# Patient Record
Sex: Male | Born: 1945 | Race: White | Hispanic: No | Marital: Married
Health system: Southern US, Community
[De-identification: ages and names within clinical notes are randomized; demographics above are authoritative.]

---

## 2008-06-16 ENCOUNTER — Ambulatory Visit: Payer: Self-pay | Admitting: Sports Medicine

## 2008-06-16 DIAGNOSIS — M7500 Adhesive capsulitis of unspecified shoulder: Secondary | ICD-10-CM | POA: Insufficient documentation

## 2008-06-16 DIAGNOSIS — M719 Bursopathy, unspecified: Secondary | ICD-10-CM

## 2008-06-16 DIAGNOSIS — M67919 Unspecified disorder of synovium and tendon, unspecified shoulder: Secondary | ICD-10-CM | POA: Insufficient documentation

## 2008-08-17 ENCOUNTER — Ambulatory Visit: Payer: Self-pay | Admitting: Sports Medicine

## 2008-08-17 DIAGNOSIS — M25569 Pain in unspecified knee: Secondary | ICD-10-CM | POA: Insufficient documentation

## 2008-08-18 ENCOUNTER — Encounter: Admission: RE | Admit: 2008-08-18 | Discharge: 2008-08-18 | Payer: Self-pay | Admitting: Family Medicine

## 2008-08-18 IMAGING — CR DG KNEE 1-2V*L*
2 series · 2 of 2 positions shown · non-contrast
Comparison: None.

CLINICAL DATA: Left knee pain

LEFT KNEE - 1-2 VIEW

[view not recorded (1 of 2)]
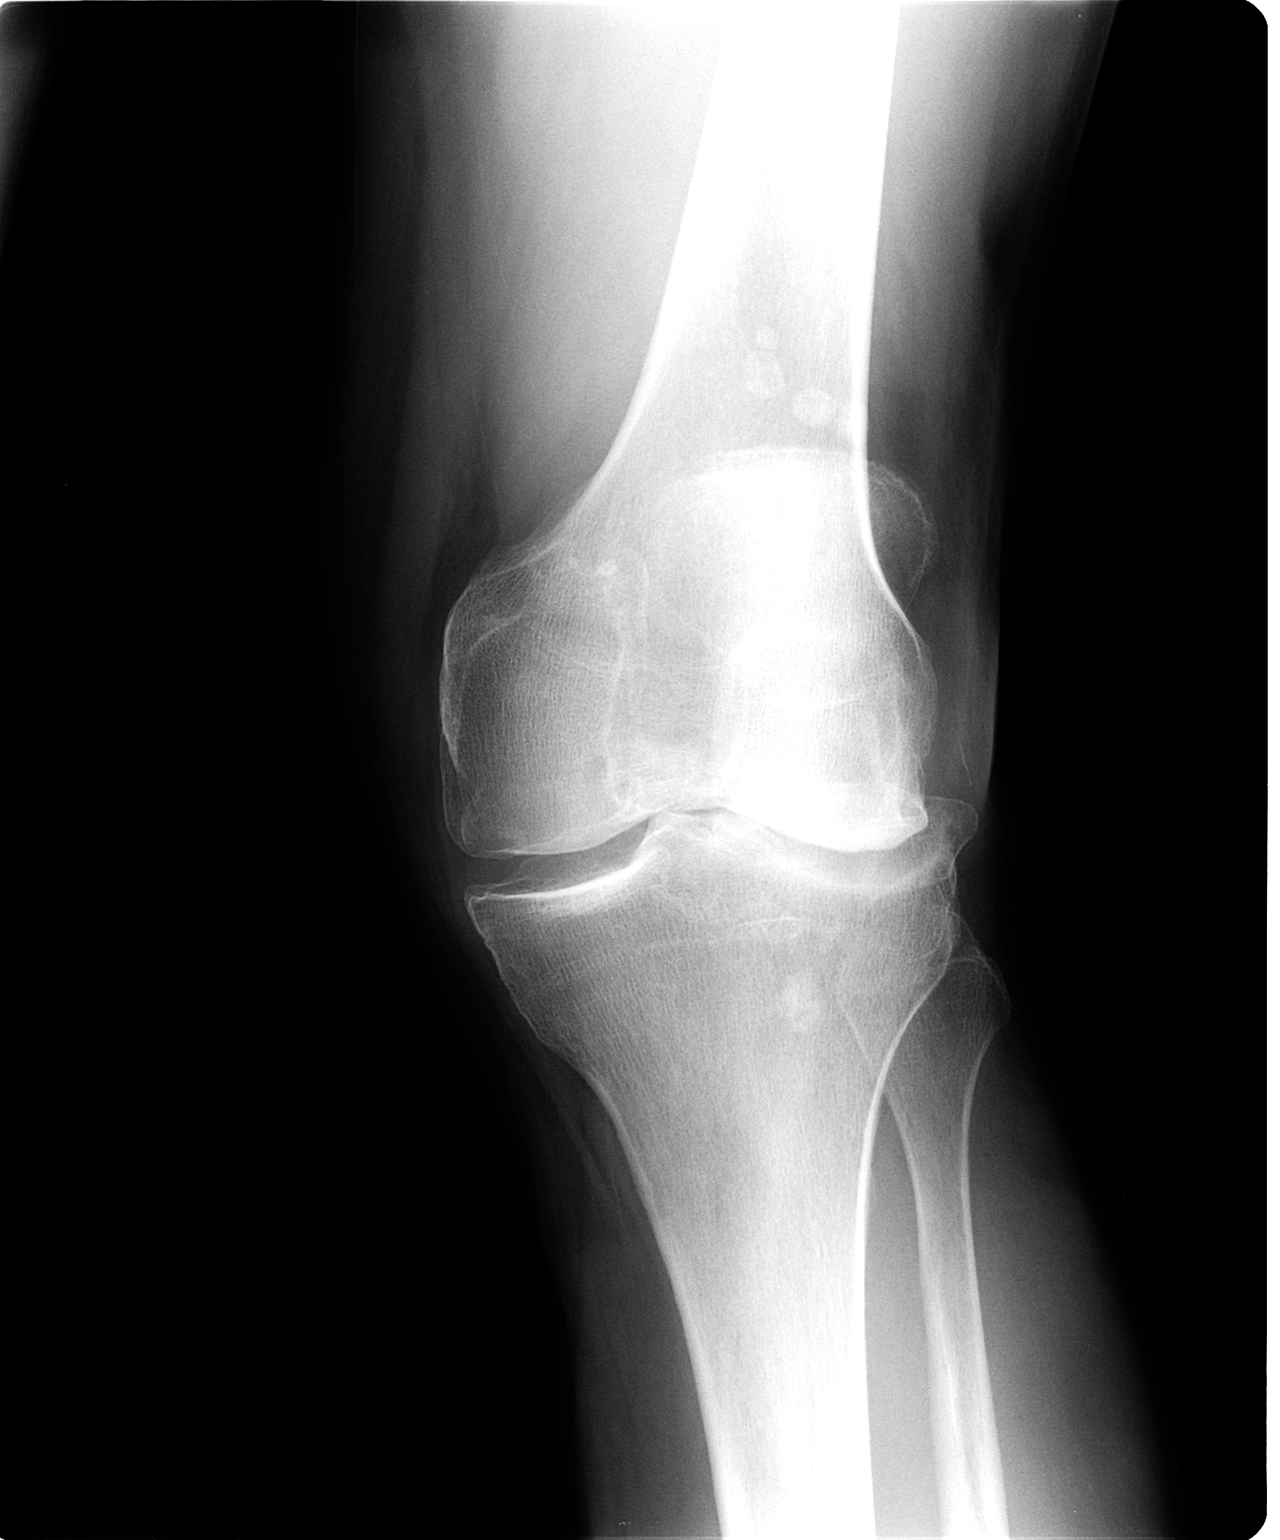

[view not recorded (2 of 2)]
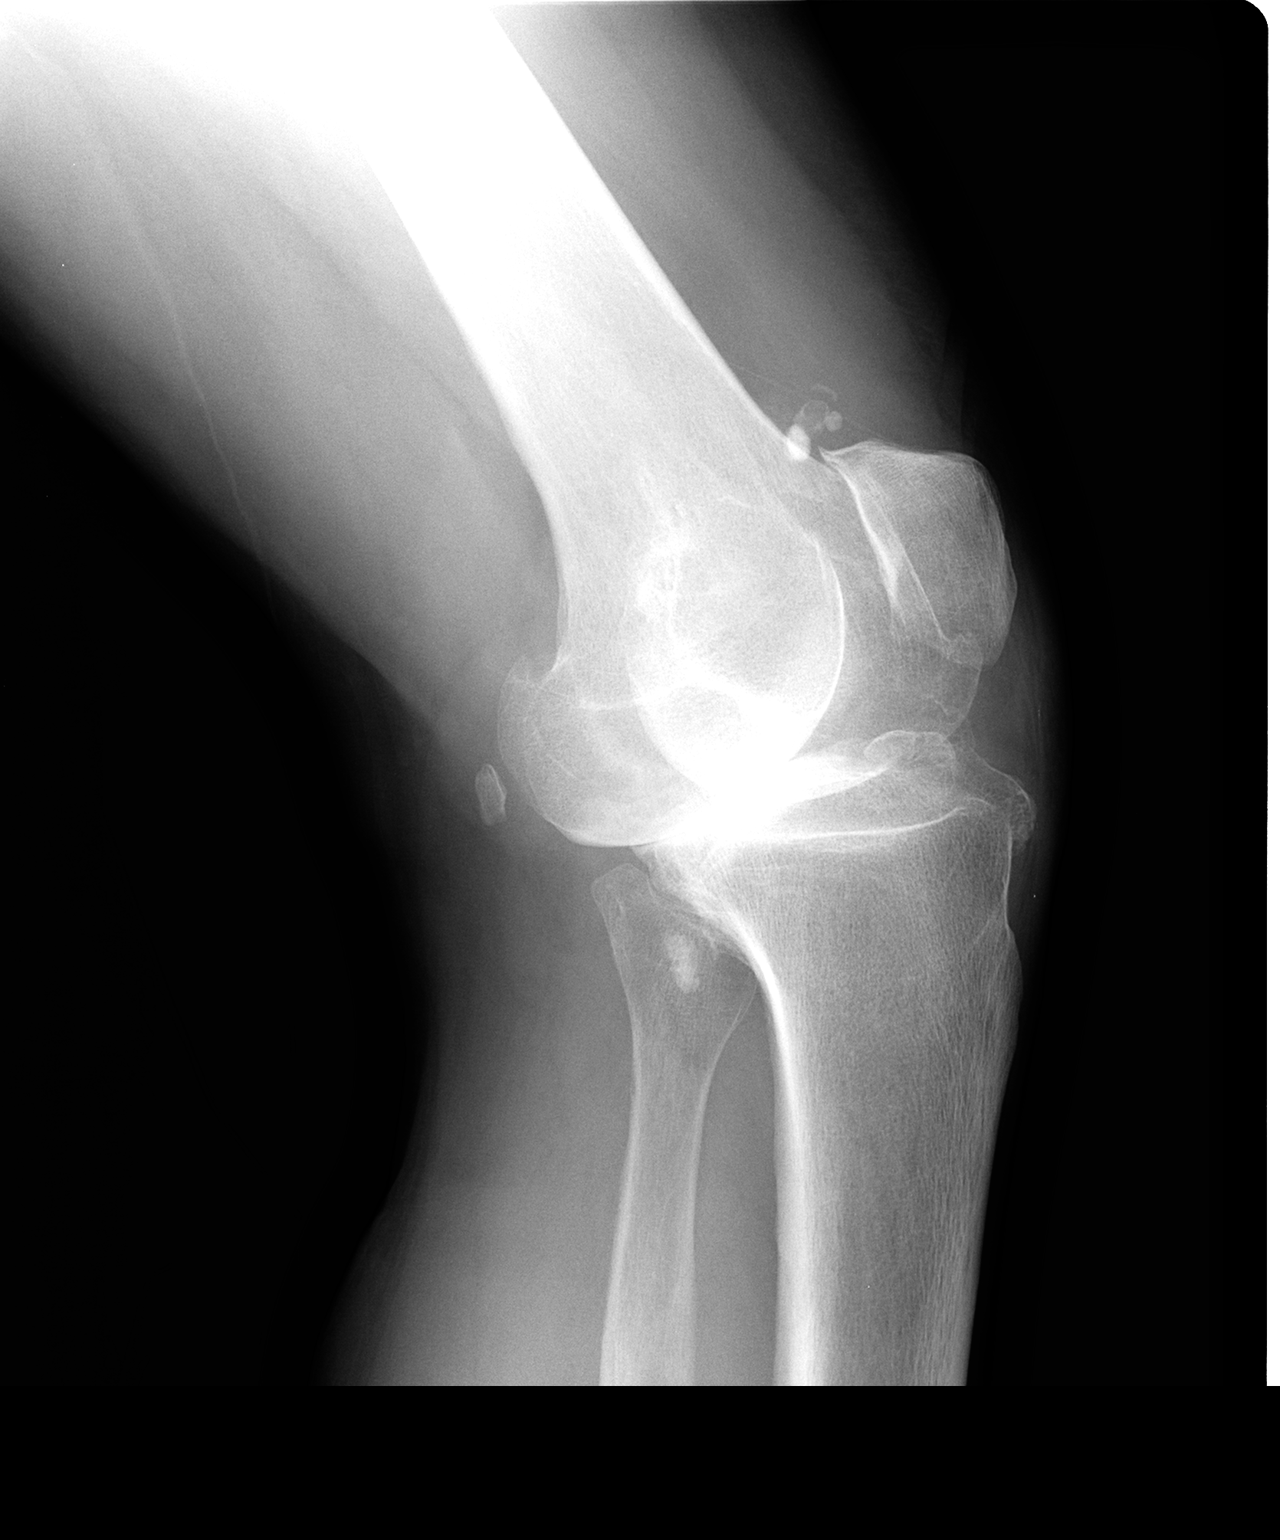

[2 of 2 positions shown; findings below may reference images not displayed]

FINDINGS: Left knee tricompartmental osteoarthritis is noted, most
severe in the lateral compartment.  No large effusion or definite
malalignment.  Lateral view is oblique in projection.  Ossified
densities in the suprapatellar region are suspicious for loose
joint articular bodies.
IMPRESSION: Tricompartmental osteoarthritis, worse in the lateral compartment.
Limited oblique view because of obliquity.
Loose articular joint bodies.

## 2008-08-19 ENCOUNTER — Encounter (INDEPENDENT_AMBULATORY_CARE_PROVIDER_SITE_OTHER): Payer: Self-pay | Admitting: *Deleted

## 2008-10-05 ENCOUNTER — Ambulatory Visit: Payer: Self-pay | Admitting: Sports Medicine

## 2008-10-05 DIAGNOSIS — M1710 Unilateral primary osteoarthritis, unspecified knee: Secondary | ICD-10-CM

## 2008-10-05 DIAGNOSIS — IMO0002 Reserved for concepts with insufficient information to code with codable children: Secondary | ICD-10-CM | POA: Insufficient documentation

## 2014-01-17 ENCOUNTER — Other Ambulatory Visit: Payer: Self-pay | Admitting: Sports Medicine

## 2014-01-17 DIAGNOSIS — M8580 Other specified disorders of bone density and structure, unspecified site: Secondary | ICD-10-CM | POA: Insufficient documentation

## 2014-01-17 NOTE — Assessment & Plan Note (Signed)
Left sided due to disuse, history of severe left knee osteoarthritis, typically favors the left side and bears weight predominantly on the right, he did have osteopenia in the left hip, we are going to repeat a bilateral DEXA scan.

## 2014-01-25 ENCOUNTER — Ambulatory Visit (INDEPENDENT_AMBULATORY_CARE_PROVIDER_SITE_OTHER): Payer: 59

## 2014-01-25 DIAGNOSIS — M8580 Other specified disorders of bone density and structure, unspecified site: Secondary | ICD-10-CM

## 2014-01-25 DIAGNOSIS — M949 Disorder of cartilage, unspecified: Secondary | ICD-10-CM

## 2014-01-25 DIAGNOSIS — M899 Disorder of bone, unspecified: Secondary | ICD-10-CM

## 2014-01-25 IMAGING — DX DG DXA BONE DENSITY STUDY HL7
4 series · 4 of 4 positions shown · non-contrast
Comparison: None.

CLINICAL DATA: Height loss, history of Actonel use in [N7]. Takes
vitamin-D and calcium daily. History osteoarthritis

EXAM:
DUAL X-RAY ABSORPTIOMETRY (DXA) FOR BONE MINERAL DENSITY

[view not recorded (1 of 4)]
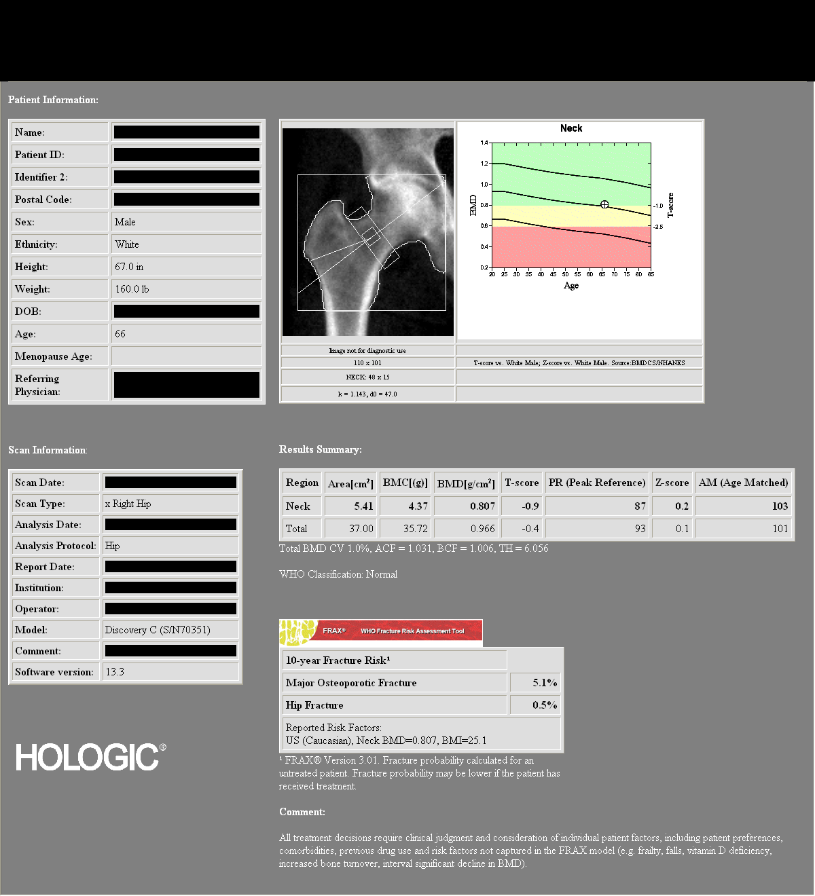

[view not recorded (2 of 4)]
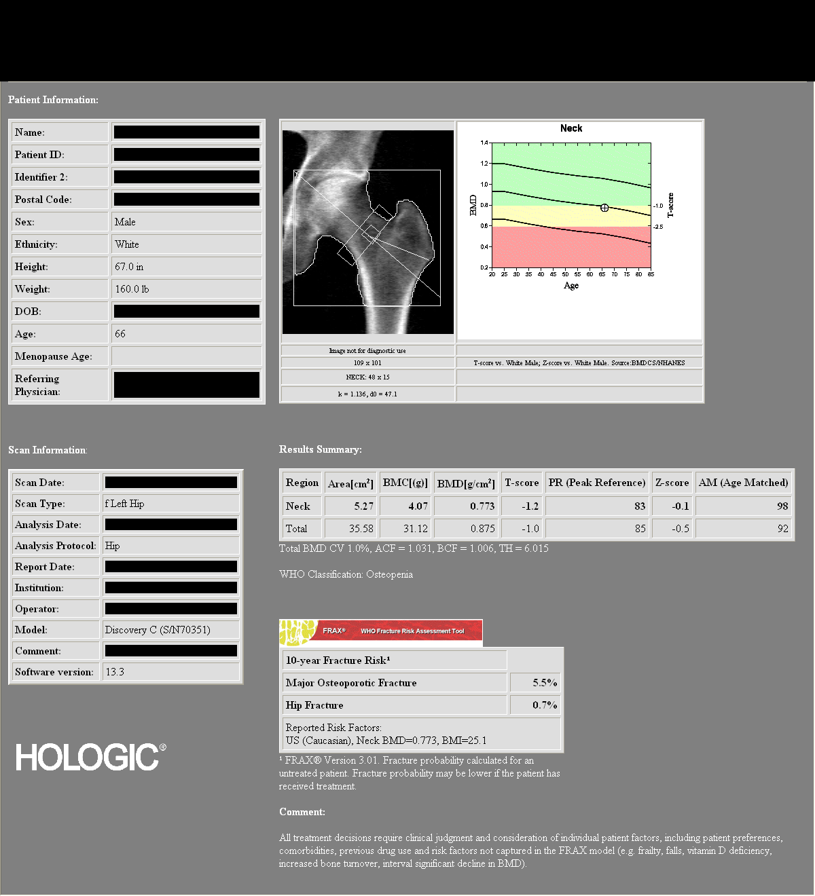

[view not recorded (3 of 4)]
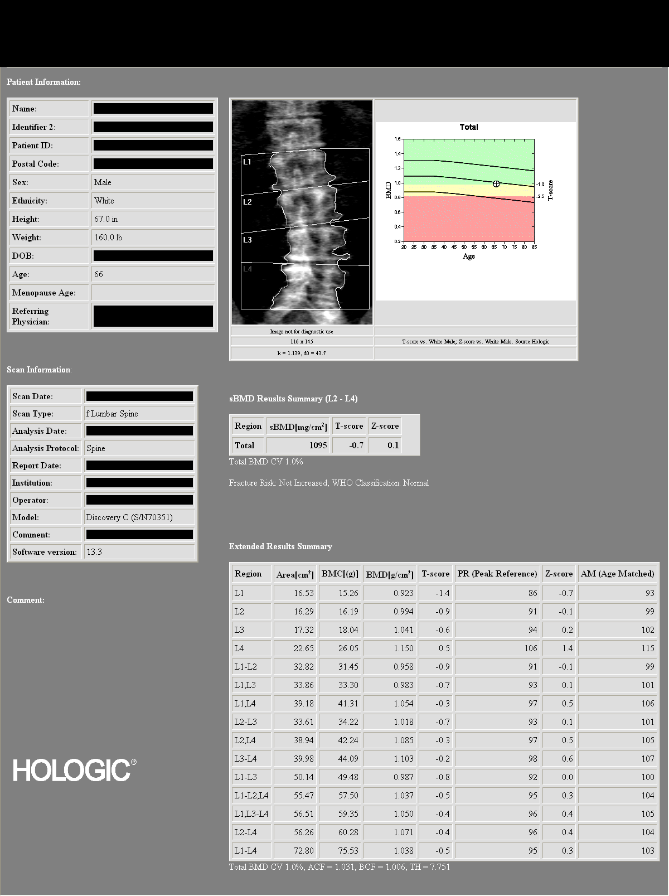

[view not recorded (4 of 4)]
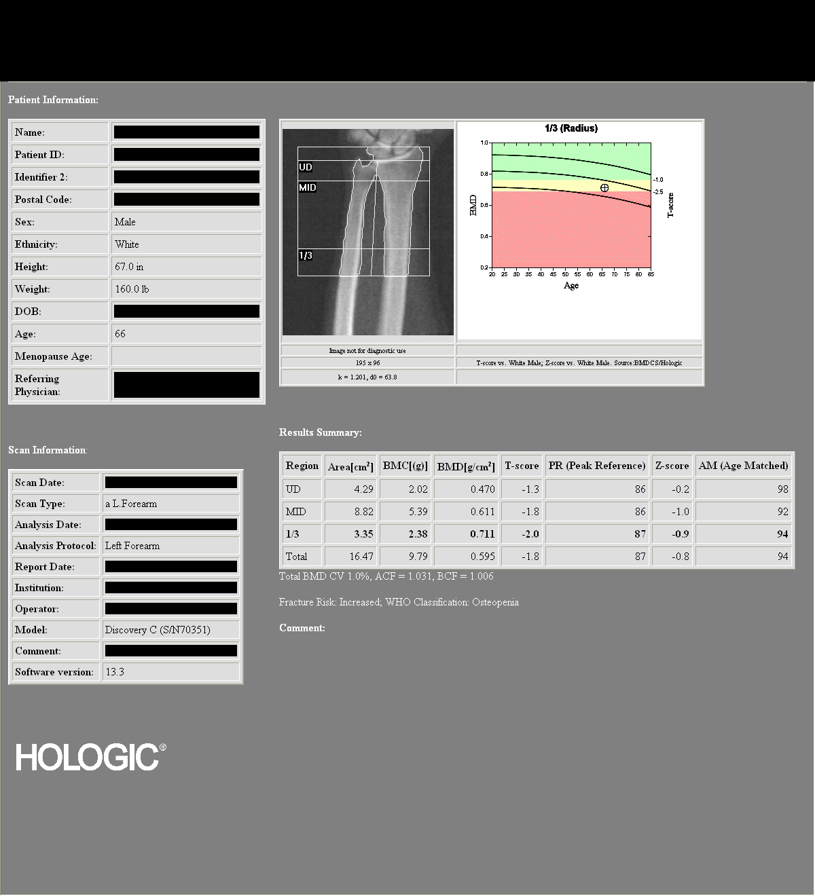

[4 of 4 positions shown; findings below may reference images not displayed]

FINDINGS: AP LUMBAR SPINE

Not utilized due to presence of degenerative disc disease changes,
endplate spur formation and scattered areas of endplate sclerosis in
lumbar spine.

RIGHT FEMUR NECK

Bone Mineral Density (BMD):  0.807 g/cm2

Young Adult T-Score: -0.9

Z-Score:

LEFT FEMUR NECK

Bone Mineral Density (BMD):  0.773 g/cm2

Young Adult T-score:  -1.2

Z-score:  -0.1

LEFT RADIUS 33%

Bone Mineral Density (BMD):  0.711 g/cm2

Young Adult T-score:  -2.0

Z-score: -0.9

ASSESSMENT: Based on assessment of the LEFT Radius 33%, patient's
diagnostic category is osteopenia by WHO Criteria.

FRACTURE RISK: Moderate

FRAX:  10 Year Fracture Risk:

Major osteoporotic fracture 5.5%

Hip fracture 0.7%
Effective therapies are available in the form of bisphosphonates,
selective estrogen receptor modulators, biologic agents, and hormone
replacement therapy (for women). All patients should ensure an
adequate intake of dietary calcium ([N7] mg daily) and vitamin D
(800 HYELLAMADA) unless contraindicated.

All treatment decisions require clinical judgment and consideration
of individual patient factors, including patient preferences,
co-morbidities, previous drug use, risk factors not captured in the
FRAX model (e.g., frailty, falls, vitamin D deficiency, increased
bone turnover, interval significant decline in bone density) and
possible under- or -over estimation of fracture risk by FRAX.

The National Osteoporosis Foundation recommends that FDA-approved
medical therapies be considered in postmenopausal women and men age
50 or older with a:

1. Hip or vertebral (clinical or morphometric) fracture.

2. T-score of -2.5 or lower at the spine or hip.

3. Ten-year fracture probability by FRAX of 3% or greater for hip
fracture or 20% or greater for major osteoporotic fracture.

People with diagnosed cases of osteoporosis or at high risk for
fracture should have regular bone mineral density tests. For
patients eligible for Medicare, routine testing is allowed once
every 2 years. The testing frequency can be increased to one year
for patients who have rapidly progressing disease, those who are
receiving or discontinuing medical therapy to restore bone mass, or
have additional risk factors.

World Health Organization (WHO) Criteria:

Normal: T-scores from +1.0 to -1.0

Low Bone Mass (Osteopenia): T-scores between -1.0 and -2.5

Osteoporosis: T-scores -2.5 and below

Comparison to Reference Population:

T-score is the key measure used in the diagnosis of osteoporosis and
relative risk determination for fracture. It provides a value for
bone mass relative to the mean bone mass of a young adult reference
population expressed in terms of standard deviation (SD).

Z-score is the age-matched score showing the patient's values
compared to a population matched for age, sex, and race. This is
also expressed in terms of standard deviation. The patient may have
values that compare favorably to the age-matched values and still be
at increased risk for fracture.

## 2014-05-04 ENCOUNTER — Ambulatory Visit (INDEPENDENT_AMBULATORY_CARE_PROVIDER_SITE_OTHER): Payer: 59 | Admitting: Sports Medicine

## 2014-05-04 DIAGNOSIS — M19019 Primary osteoarthritis, unspecified shoulder: Secondary | ICD-10-CM | POA: Insufficient documentation

## 2014-05-04 DIAGNOSIS — M19012 Primary osteoarthritis, left shoulder: Secondary | ICD-10-CM

## 2014-05-04 NOTE — Assessment & Plan Note (Signed)
With deficient rotator cuff. Glenohumeral injection as above. Formal physical therapy. Return as needed. I do think he would be a candidate for reverse total shoulder arthroplasty.

## 2014-05-04 NOTE — Progress Notes (Signed)
   Subjective:    I'm seeing this patient as a consultation for:  Dr. 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 Previous injection in the distant past was effective. Pain is moderate, persistent, localized at the joint lines without radiation. He does have a significant amount of difficulty with abduction.  Past medical history, Surgical history, Family history not pertinant except as noted below, Social history, Allergies, and medications have been entered into the medical record, reviewed, and no changes needed.   Review of Systems: No headache, visual changes, nausea, vomiting, diarrhea, constipation, dizziness, abdominal pain, skin rash, fevers, chills, night sweats, weight loss, swollen lymph nodes, body aches, joint swelling, muscle aches, chest pain, shortness of breath, mood changes, visual or auditory hallucinations.   Objective:   General: Well Developed, well nourished, and in no acute distress.  Neuro/Psych: Alert and oriented x3, extra-ocular muscles intact, able to move all 4 extremities, sensation grossly intact. Skin: Warm and dry, no rashes noted.  Respiratory: Not using accessory muscles, speaking in full sentences, trachea midline.  Cardiovascular: Pulses palpable, no extremity edema. Abdomen: Does not appear distended. Left shoulder: Significant crepitus when taken through the range of motion with tenderness to palpation at the joint lines, abduction to approximately 30, subsequent Abduction is from scapular elevation.  Procedure: Real-time Ultrasound Guided Injection of left glenohumeral joint Device: GE Logiq E  Verbal informed consent obtained.  Time-out conducted.  Noted no overlying erythema, induration, or other signs of local infection.  Skin prepped in a sterile fashion.  Local anesthesia: Topical Ethyl  chloride.  With sterile technique and under real time ultrasound guidance:  Noted full-thickness retracted tear of the supraspinatus and partial thickness tears of the subscapularis and infraspinatus, spinal needle advanced into the glenohumeral joint, 1 mL kenalog 40, 4 mL lidocaine injected easily. Completed without difficulty  Pain immediately resolved suggesting accurate placement of the medication.  Advised to call if fevers/chills, erythema, induration, drainage, or persistent bleeding.  Images permanently stored and available for review in the ultrasound unit.  Impression: Technically successful ultrasound guided injection.  Impression and Recommendations:   This case required medical decision making of moderate complexity.

## 2015-06-22 DIAGNOSIS — Z79899 Other long term (current) drug therapy: Secondary | ICD-10-CM | POA: Diagnosis not present

## 2015-06-22 DIAGNOSIS — Z96652 Presence of left artificial knee joint: Secondary | ICD-10-CM | POA: Diagnosis not present

## 2015-06-22 DIAGNOSIS — N401 Enlarged prostate with lower urinary tract symptoms: Secondary | ICD-10-CM | POA: Diagnosis not present

## 2015-06-22 DIAGNOSIS — B351 Tinea unguium: Secondary | ICD-10-CM | POA: Diagnosis not present

## 2015-06-22 DIAGNOSIS — R972 Elevated prostate specific antigen [PSA]: Secondary | ICD-10-CM | POA: Diagnosis not present

## 2015-06-22 DIAGNOSIS — E785 Hyperlipidemia, unspecified: Secondary | ICD-10-CM | POA: Diagnosis not present

## 2015-06-22 DIAGNOSIS — N400 Enlarged prostate without lower urinary tract symptoms: Secondary | ICD-10-CM | POA: Diagnosis not present

## 2015-06-22 DIAGNOSIS — Z0000 Encounter for general adult medical examination without abnormal findings: Secondary | ICD-10-CM | POA: Diagnosis not present

## 2015-06-22 DIAGNOSIS — N529 Male erectile dysfunction, unspecified: Secondary | ICD-10-CM | POA: Diagnosis not present

## 2015-06-22 DIAGNOSIS — Z23 Encounter for immunization: Secondary | ICD-10-CM | POA: Diagnosis not present

## 2015-06-22 DIAGNOSIS — M1712 Unilateral primary osteoarthritis, left knee: Secondary | ICD-10-CM | POA: Diagnosis not present

## 2015-06-26 DIAGNOSIS — E785 Hyperlipidemia, unspecified: Secondary | ICD-10-CM | POA: Diagnosis not present

## 2015-06-26 DIAGNOSIS — I10 Essential (primary) hypertension: Secondary | ICD-10-CM | POA: Diagnosis not present

## 2015-06-26 DIAGNOSIS — M1712 Unilateral primary osteoarthritis, left knee: Secondary | ICD-10-CM | POA: Diagnosis not present

## 2015-07-10 DIAGNOSIS — N400 Enlarged prostate without lower urinary tract symptoms: Secondary | ICD-10-CM | POA: Diagnosis not present

## 2015-07-10 DIAGNOSIS — R972 Elevated prostate specific antigen [PSA]: Secondary | ICD-10-CM | POA: Diagnosis not present

## 2015-08-04 MED FILL — CICLOPIROX 8% SOLUTION: 8 | 30 days supply | Qty: 7 | Fill #0

## 2015-08-17 MED FILL — ATORVASTATIN 40 MG TABLET: 40 | 90 days supply | Qty: 90 | Fill #0

## 2015-08-25 MED FILL — FENOFIBRATE 160 MG TABLET: 160 | 30 days supply | Qty: 30 | Fill #1

## 2015-10-06 MED FILL — FENOFIBRATE 160 MG TABLET: 160 | 30 days supply | Qty: 30 | Fill #0

## 2015-11-09 MED FILL — FENOFIBRATE 160 MG TABLET: 160 | 30 days supply | Qty: 30 | Fill #1

## 2015-12-25 MED FILL — ATORVASTATIN 40 MG TABLET: 40 | 90 days supply | Qty: 90 | Fill #1

## 2015-12-25 MED FILL — FENOFIBRATE 160 MG TABLET: 160 | 30 days supply | Qty: 30 | Fill #2

## 2016-01-31 MED FILL — FENOFIBRATE 160 MG TABLET: 160 | 30 days supply | Qty: 30 | Fill #3

## 2016-03-04 MED FILL — FENOFIBRATE 160 MG TABLET: 160 | 30 days supply | Qty: 30 | Fill #4

## 2016-03-20 DIAGNOSIS — E785 Hyperlipidemia, unspecified: Secondary | ICD-10-CM | POA: Diagnosis not present

## 2016-03-20 DIAGNOSIS — Z471 Aftercare following joint replacement surgery: Secondary | ICD-10-CM | POA: Diagnosis not present

## 2016-03-20 DIAGNOSIS — M25462 Effusion, left knee: Secondary | ICD-10-CM | POA: Diagnosis not present

## 2016-03-20 DIAGNOSIS — Z96652 Presence of left artificial knee joint: Secondary | ICD-10-CM | POA: Diagnosis not present

## 2016-03-20 DIAGNOSIS — N401 Enlarged prostate with lower urinary tract symptoms: Secondary | ICD-10-CM | POA: Diagnosis not present

## 2016-03-26 MED FILL — ATORVASTATIN 40 MG TABLET: 40 | 90 days supply | Qty: 90 | Fill #0

## 2016-04-05 MED FILL — FENOFIBRATE 160 MG TABLET: 160 | 30 days supply | Qty: 30 | Fill #5

## 2016-05-20 MED FILL — FENOFIBRATE 160 MG TABLET: 160 | 90 days supply | Qty: 90 | Fill #0

## 2016-06-28 DIAGNOSIS — E785 Hyperlipidemia, unspecified: Secondary | ICD-10-CM | POA: Diagnosis not present

## 2016-06-28 DIAGNOSIS — Z0000 Encounter for general adult medical examination without abnormal findings: Secondary | ICD-10-CM | POA: Diagnosis not present

## 2016-06-28 DIAGNOSIS — N529 Male erectile dysfunction, unspecified: Secondary | ICD-10-CM | POA: Diagnosis not present

## 2016-06-28 DIAGNOSIS — N401 Enlarged prostate with lower urinary tract symptoms: Secondary | ICD-10-CM | POA: Diagnosis not present

## 2016-06-28 DIAGNOSIS — R972 Elevated prostate specific antigen [PSA]: Secondary | ICD-10-CM | POA: Diagnosis not present

## 2016-06-28 DIAGNOSIS — R030 Elevated blood-pressure reading, without diagnosis of hypertension: Secondary | ICD-10-CM | POA: Diagnosis not present

## 2016-06-28 DIAGNOSIS — M1712 Unilateral primary osteoarthritis, left knee: Secondary | ICD-10-CM | POA: Diagnosis not present

## 2016-07-04 DIAGNOSIS — E785 Hyperlipidemia, unspecified: Secondary | ICD-10-CM | POA: Diagnosis not present

## 2016-07-04 DIAGNOSIS — R972 Elevated prostate specific antigen [PSA]: Secondary | ICD-10-CM | POA: Diagnosis not present

## 2016-07-04 DIAGNOSIS — R030 Elevated blood-pressure reading, without diagnosis of hypertension: Secondary | ICD-10-CM | POA: Diagnosis not present

## 2016-07-09 DIAGNOSIS — H40053 Ocular hypertension, bilateral: Secondary | ICD-10-CM | POA: Diagnosis not present

## 2016-07-09 DIAGNOSIS — H2513 Age-related nuclear cataract, bilateral: Secondary | ICD-10-CM | POA: Diagnosis not present

## 2016-07-22 MED FILL — ATORVASTATIN 40 MG TABLET: 40 | 90 days supply | Qty: 90 | Fill #1

## 2016-08-13 DIAGNOSIS — N529 Male erectile dysfunction, unspecified: Secondary | ICD-10-CM | POA: Diagnosis not present

## 2016-08-13 DIAGNOSIS — R972 Elevated prostate specific antigen [PSA]: Secondary | ICD-10-CM | POA: Diagnosis not present

## 2016-08-21 MED FILL — FENOFIBRATE 160 MG TABLET: 160 | 90 days supply | Qty: 90 | Fill #1

## 2016-09-03 DIAGNOSIS — R972 Elevated prostate specific antigen [PSA]: Secondary | ICD-10-CM | POA: Diagnosis not present

## 2016-09-17 MED FILL — CICLOPIROX 8% SOLUTION: 8 | 30 days supply | Qty: 7 | Fill #0

## 2016-10-31 MED FILL — ATORVASTATIN 40 MG TABLET: 40 | 90 days supply | Qty: 90 | Fill #2

## 2016-12-24 MED FILL — FENOFIBRATE 160 MG TABLET: 160 | 90 days supply | Qty: 90 | Fill #0

## 2017-02-21 MED FILL — ATORVASTATIN 40 MG TABLET: 40 | 90 days supply | Qty: 90 | Fill #0

## 2017-03-31 MED FILL — FENOFIBRATE 160 MG TABLET: 160 | 90 days supply | Qty: 90 | Fill #1

## 2017-05-16 DIAGNOSIS — R972 Elevated prostate specific antigen [PSA]: Secondary | ICD-10-CM | POA: Diagnosis not present

## 2017-05-20 DIAGNOSIS — N529 Male erectile dysfunction, unspecified: Secondary | ICD-10-CM | POA: Diagnosis not present

## 2017-05-20 DIAGNOSIS — R972 Elevated prostate specific antigen [PSA]: Secondary | ICD-10-CM | POA: Diagnosis not present

## 2017-05-20 MED FILL — ATORVASTATIN 40 MG TABLET: 40 | 90 days supply | Qty: 90 | Fill #1

## 2017-06-24 DIAGNOSIS — R972 Elevated prostate specific antigen [PSA]: Secondary | ICD-10-CM | POA: Diagnosis not present

## 2017-06-27 DIAGNOSIS — M1711 Unilateral primary osteoarthritis, right knee: Secondary | ICD-10-CM | POA: Diagnosis not present

## 2017-06-27 DIAGNOSIS — Z96652 Presence of left artificial knee joint: Secondary | ICD-10-CM | POA: Diagnosis not present

## 2017-06-27 DIAGNOSIS — E785 Hyperlipidemia, unspecified: Secondary | ICD-10-CM | POA: Diagnosis not present

## 2017-06-27 DIAGNOSIS — N400 Enlarged prostate without lower urinary tract symptoms: Secondary | ICD-10-CM | POA: Diagnosis not present

## 2017-07-03 DIAGNOSIS — M1712 Unilateral primary osteoarthritis, left knee: Secondary | ICD-10-CM | POA: Diagnosis not present

## 2017-07-03 DIAGNOSIS — R030 Elevated blood-pressure reading, without diagnosis of hypertension: Secondary | ICD-10-CM | POA: Diagnosis not present

## 2017-07-03 DIAGNOSIS — N401 Enlarged prostate with lower urinary tract symptoms: Secondary | ICD-10-CM | POA: Diagnosis not present

## 2017-07-03 DIAGNOSIS — Z566 Other physical and mental strain related to work: Secondary | ICD-10-CM | POA: Diagnosis not present

## 2017-07-03 DIAGNOSIS — N529 Male erectile dysfunction, unspecified: Secondary | ICD-10-CM | POA: Diagnosis not present

## 2017-07-03 DIAGNOSIS — E785 Hyperlipidemia, unspecified: Secondary | ICD-10-CM | POA: Diagnosis not present

## 2017-07-03 DIAGNOSIS — Z0000 Encounter for general adult medical examination without abnormal findings: Secondary | ICD-10-CM | POA: Diagnosis not present

## 2017-07-03 DIAGNOSIS — R972 Elevated prostate specific antigen [PSA]: Secondary | ICD-10-CM | POA: Diagnosis not present

## 2017-07-11 DIAGNOSIS — H40053 Ocular hypertension, bilateral: Secondary | ICD-10-CM | POA: Diagnosis not present

## 2017-07-11 DIAGNOSIS — H2513 Age-related nuclear cataract, bilateral: Secondary | ICD-10-CM | POA: Diagnosis not present

## 2017-07-28 MED FILL — FENOFIBRATE 160 MG TABLET: 160 | 90 days supply | Qty: 90 | Fill #0

## 2017-08-04 DIAGNOSIS — E785 Hyperlipidemia, unspecified: Secondary | ICD-10-CM | POA: Diagnosis not present

## 2017-09-26 MED FILL — ATORVASTATIN 40 MG TABLET: 40 | 90 days supply | Qty: 90 | Fill #2

## 2017-10-24 MED FILL — FENOFIBRATE 160 MG TABLET: 160 | 90 days supply | Qty: 90 | Fill #1

## 2018-01-06 MED FILL — ATORVASTATIN 40 MG TABLET: 40 | 90 days supply | Qty: 90 | Fill #0

## 2018-01-23 DIAGNOSIS — R972 Elevated prostate specific antigen [PSA]: Secondary | ICD-10-CM | POA: Diagnosis not present

## 2018-01-27 MED FILL — FENOFIBRATE 160 MG TABLET: 160 | 90 days supply | Qty: 90 | Fill #2

## 2018-03-12 DIAGNOSIS — R972 Elevated prostate specific antigen [PSA]: Secondary | ICD-10-CM | POA: Diagnosis not present

## 2018-03-12 DIAGNOSIS — N400 Enlarged prostate without lower urinary tract symptoms: Secondary | ICD-10-CM | POA: Diagnosis not present

## 2018-04-08 MED FILL — ATORVASTATIN 40 MG TABLET: 40 | 90 days supply | Qty: 90 | Fill #1

## 2018-05-26 MED FILL — FENOFIBRATE 160 MG TABLET: 160 | 30 days supply | Qty: 30 | Fill #0

## 2018-06-04 DIAGNOSIS — N529 Male erectile dysfunction, unspecified: Secondary | ICD-10-CM | POA: Diagnosis not present

## 2018-06-04 DIAGNOSIS — R361 Hematospermia: Secondary | ICD-10-CM | POA: Diagnosis not present

## 2018-06-04 DIAGNOSIS — R972 Elevated prostate specific antigen [PSA]: Secondary | ICD-10-CM | POA: Diagnosis not present

## 2018-07-02 DIAGNOSIS — M11212 Other chondrocalcinosis, left shoulder: Secondary | ICD-10-CM | POA: Diagnosis not present

## 2018-07-02 DIAGNOSIS — M19012 Primary osteoarthritis, left shoulder: Secondary | ICD-10-CM | POA: Diagnosis not present

## 2018-07-02 DIAGNOSIS — Z96652 Presence of left artificial knee joint: Secondary | ICD-10-CM | POA: Diagnosis not present

## 2018-07-13 DIAGNOSIS — H2513 Age-related nuclear cataract, bilateral: Secondary | ICD-10-CM | POA: Diagnosis not present

## 2018-07-13 DIAGNOSIS — H40053 Ocular hypertension, bilateral: Secondary | ICD-10-CM | POA: Diagnosis not present

## 2018-07-15 DIAGNOSIS — N529 Male erectile dysfunction, unspecified: Secondary | ICD-10-CM | POA: Diagnosis not present

## 2018-07-15 DIAGNOSIS — M25512 Pain in left shoulder: Secondary | ICD-10-CM | POA: Diagnosis not present

## 2018-07-15 DIAGNOSIS — R972 Elevated prostate specific antigen [PSA]: Secondary | ICD-10-CM | POA: Diagnosis not present

## 2018-07-15 DIAGNOSIS — M1712 Unilateral primary osteoarthritis, left knee: Secondary | ICD-10-CM | POA: Diagnosis not present

## 2018-07-15 DIAGNOSIS — E785 Hyperlipidemia, unspecified: Secondary | ICD-10-CM | POA: Diagnosis not present

## 2018-07-15 DIAGNOSIS — N401 Enlarged prostate with lower urinary tract symptoms: Secondary | ICD-10-CM | POA: Diagnosis not present

## 2018-07-15 DIAGNOSIS — Z0000 Encounter for general adult medical examination without abnormal findings: Secondary | ICD-10-CM | POA: Diagnosis not present

## 2018-07-15 DIAGNOSIS — G8929 Other chronic pain: Secondary | ICD-10-CM | POA: Diagnosis not present

## 2018-07-15 MED FILL — FENOFIBRATE 160 MG TABLET: 160 | 90 days supply | Qty: 90 | Fill #0

## 2018-07-16 DIAGNOSIS — M1712 Unilateral primary osteoarthritis, left knee: Secondary | ICD-10-CM | POA: Diagnosis not present

## 2018-07-16 DIAGNOSIS — E785 Hyperlipidemia, unspecified: Secondary | ICD-10-CM | POA: Diagnosis not present

## 2018-07-20 MED FILL — ATORVASTATIN 40 MG TABLET: 40 | 90 days supply | Qty: 90 | Fill #2

## 2018-07-30 DIAGNOSIS — M1990 Unspecified osteoarthritis, unspecified site: Secondary | ICD-10-CM | POA: Diagnosis not present

## 2018-07-30 DIAGNOSIS — E785 Hyperlipidemia, unspecified: Secondary | ICD-10-CM | POA: Diagnosis not present

## 2018-07-30 DIAGNOSIS — N400 Enlarged prostate without lower urinary tract symptoms: Secondary | ICD-10-CM | POA: Diagnosis not present

## 2018-07-30 DIAGNOSIS — F439 Reaction to severe stress, unspecified: Secondary | ICD-10-CM | POA: Diagnosis not present

## 2018-10-12 MED FILL — FENOFIBRATE 160 MG TABLET: 160 | 90 days supply | Qty: 90 | Fill #1

## 2018-10-12 MED FILL — ATORVASTATIN 40 MG TABLET: 40 | 90 days supply | Qty: 90 | Fill #0

## 2019-01-26 MED FILL — FENOFIBRATE 160 MG TABLET: 160 | 90 days supply | Qty: 90 | Fill #2

## 2019-01-26 MED FILL — ATORVASTATIN 40 MG TABLET: 40 | 90 days supply | Qty: 90 | Fill #1

## 2019-02-04 MED FILL — ATORVASTATIN 40 MG TABLET: 40 | 90 days supply | Qty: 90 | Fill #1

## 2019-02-04 MED FILL — FENOFIBRATE 160 MG TABLET: 160 | 90 days supply | Qty: 90 | Fill #2

## 2019-02-05 DIAGNOSIS — R972 Elevated prostate specific antigen [PSA]: Secondary | ICD-10-CM | POA: Diagnosis not present

## 2019-03-10 DIAGNOSIS — M19019 Primary osteoarthritis, unspecified shoulder: Secondary | ICD-10-CM | POA: Diagnosis not present

## 2019-03-10 DIAGNOSIS — M25512 Pain in left shoulder: Secondary | ICD-10-CM | POA: Diagnosis not present

## 2019-03-10 DIAGNOSIS — M2612 Other jaw asymmetry: Secondary | ICD-10-CM | POA: Diagnosis not present

## 2019-03-10 DIAGNOSIS — R29898 Other symptoms and signs involving the musculoskeletal system: Secondary | ICD-10-CM | POA: Diagnosis not present

## 2019-03-11 DIAGNOSIS — D485 Neoplasm of uncertain behavior of skin: Secondary | ICD-10-CM | POA: Diagnosis not present

## 2019-03-25 DIAGNOSIS — M2612 Other jaw asymmetry: Secondary | ICD-10-CM | POA: Diagnosis not present

## 2019-03-25 DIAGNOSIS — Z96652 Presence of left artificial knee joint: Secondary | ICD-10-CM | POA: Diagnosis not present

## 2019-03-25 DIAGNOSIS — M25462 Effusion, left knee: Secondary | ICD-10-CM | POA: Diagnosis not present

## 2019-03-25 DIAGNOSIS — Z471 Aftercare following joint replacement surgery: Secondary | ICD-10-CM | POA: Diagnosis not present

## 2019-03-25 DIAGNOSIS — M19019 Primary osteoarthritis, unspecified shoulder: Secondary | ICD-10-CM | POA: Diagnosis not present

## 2019-03-25 DIAGNOSIS — R29898 Other symptoms and signs involving the musculoskeletal system: Secondary | ICD-10-CM | POA: Diagnosis not present

## 2019-03-25 DIAGNOSIS — M25512 Pain in left shoulder: Secondary | ICD-10-CM | POA: Diagnosis not present

## 2019-03-29 DIAGNOSIS — L720 Epidermal cyst: Secondary | ICD-10-CM | POA: Diagnosis not present

## 2019-03-29 DIAGNOSIS — L989 Disorder of the skin and subcutaneous tissue, unspecified: Secondary | ICD-10-CM | POA: Diagnosis not present

## 2019-03-29 DIAGNOSIS — R238 Other skin changes: Secondary | ICD-10-CM | POA: Diagnosis not present

## 2019-03-29 DIAGNOSIS — D2339 Other benign neoplasm of skin of other parts of face: Secondary | ICD-10-CM | POA: Diagnosis not present

## 2019-04-07 DIAGNOSIS — M19019 Primary osteoarthritis, unspecified shoulder: Secondary | ICD-10-CM | POA: Diagnosis not present

## 2019-04-07 DIAGNOSIS — M25512 Pain in left shoulder: Secondary | ICD-10-CM | POA: Diagnosis not present

## 2019-04-07 DIAGNOSIS — M2612 Other jaw asymmetry: Secondary | ICD-10-CM | POA: Diagnosis not present

## 2019-04-07 DIAGNOSIS — R29898 Other symptoms and signs involving the musculoskeletal system: Secondary | ICD-10-CM | POA: Diagnosis not present

## 2019-04-21 DIAGNOSIS — M25512 Pain in left shoulder: Secondary | ICD-10-CM | POA: Diagnosis not present

## 2019-04-21 DIAGNOSIS — M2612 Other jaw asymmetry: Secondary | ICD-10-CM | POA: Diagnosis not present

## 2019-04-21 DIAGNOSIS — R29898 Other symptoms and signs involving the musculoskeletal system: Secondary | ICD-10-CM | POA: Diagnosis not present

## 2019-04-21 DIAGNOSIS — M19019 Primary osteoarthritis, unspecified shoulder: Secondary | ICD-10-CM | POA: Diagnosis not present

## 2019-05-06 MED FILL — ATORVASTATIN 40 MG TABLET: 40 | 90 days supply | Qty: 90 | Fill #2

## 2019-05-06 MED FILL — FENOFIBRATE 160 MG TABLET: 160 | 90 days supply | Qty: 90 | Fill #3

## 2019-06-16 DIAGNOSIS — R29898 Other symptoms and signs involving the musculoskeletal system: Secondary | ICD-10-CM | POA: Diagnosis not present

## 2019-06-16 DIAGNOSIS — M25512 Pain in left shoulder: Secondary | ICD-10-CM | POA: Diagnosis not present

## 2019-06-16 DIAGNOSIS — G8929 Other chronic pain: Secondary | ICD-10-CM | POA: Diagnosis not present

## 2019-06-16 DIAGNOSIS — M19019 Primary osteoarthritis, unspecified shoulder: Secondary | ICD-10-CM | POA: Diagnosis not present

## 2019-06-16 DIAGNOSIS — M25612 Stiffness of left shoulder, not elsewhere classified: Secondary | ICD-10-CM | POA: Diagnosis not present

## 2019-07-19 DIAGNOSIS — R030 Elevated blood-pressure reading, without diagnosis of hypertension: Secondary | ICD-10-CM | POA: Diagnosis not present

## 2019-07-19 DIAGNOSIS — Z0000 Encounter for general adult medical examination without abnormal findings: Secondary | ICD-10-CM | POA: Diagnosis not present

## 2019-07-19 DIAGNOSIS — R972 Elevated prostate specific antigen [PSA]: Secondary | ICD-10-CM | POA: Diagnosis not present

## 2019-07-19 DIAGNOSIS — E785 Hyperlipidemia, unspecified: Secondary | ICD-10-CM | POA: Diagnosis not present

## 2019-07-19 DIAGNOSIS — N401 Enlarged prostate with lower urinary tract symptoms: Secondary | ICD-10-CM | POA: Diagnosis not present

## 2019-07-19 DIAGNOSIS — N529 Male erectile dysfunction, unspecified: Secondary | ICD-10-CM | POA: Diagnosis not present

## 2019-07-19 DIAGNOSIS — M1712 Unilateral primary osteoarthritis, left knee: Secondary | ICD-10-CM | POA: Diagnosis not present

## 2019-07-19 MED FILL — CICLOPIROX 8% SOLUTION: 8 | 15 days supply | Qty: 7 | Fill #0

## 2019-08-02 MED FILL — CICLOPIROX 8% SOLUTION: 8 | 15 days supply | Qty: 7 | Fill #1

## 2019-08-02 MED FILL — ATORVASTATIN 40 MG TABLET: 40 | 90 days supply | Qty: 90 | Fill #0

## 2019-08-02 MED FILL — FENOFIBRATE 160 MG TABLET: 160 | 90 days supply | Qty: 90 | Fill #0

## 2019-08-06 DIAGNOSIS — F439 Reaction to severe stress, unspecified: Secondary | ICD-10-CM | POA: Diagnosis not present

## 2019-08-06 DIAGNOSIS — N400 Enlarged prostate without lower urinary tract symptoms: Secondary | ICD-10-CM | POA: Diagnosis not present

## 2019-08-06 DIAGNOSIS — Z23 Encounter for immunization: Secondary | ICD-10-CM | POA: Diagnosis not present

## 2019-08-06 DIAGNOSIS — F329 Major depressive disorder, single episode, unspecified: Secondary | ICD-10-CM | POA: Diagnosis not present

## 2019-08-06 DIAGNOSIS — E785 Hyperlipidemia, unspecified: Secondary | ICD-10-CM | POA: Diagnosis not present

## 2019-11-02 MED FILL — FENOFIBRATE 160 MG TABLET: 160 | 90 days supply | Qty: 90 | Fill #1

## 2019-11-02 MED FILL — ATORVASTATIN CALCIUM 40 MG: 40 | 90 days supply | Qty: 90 | Fill #1

## 2020-01-24 DIAGNOSIS — Z20828 Contact with and (suspected) exposure to other viral communicable diseases: Secondary | ICD-10-CM | POA: Diagnosis not present

## 2020-01-28 ENCOUNTER — Other Ambulatory Visit (HOSPITAL_COMMUNITY): Payer: Self-pay | Admitting: Nurse Practitioner

## 2020-01-28 ENCOUNTER — Telehealth (HOSPITAL_COMMUNITY): Payer: Self-pay | Admitting: Nurse Practitioner

## 2020-01-28 DIAGNOSIS — U071 COVID-19: Secondary | ICD-10-CM

## 2020-01-28 NOTE — Telephone Encounter (Signed)
Called to Discuss with patient about Covid symptoms and the use of regeneron, a monoclonal antibody infusion for those with mild to moderate Covid symptoms and at a high risk of hospitalization.     Pt is qualified for this infusion at the [facility] due to co-morbid conditions and/or a member of an at-risk group.     Unable to reach pt. Left message to return call  Beckey Rutter, [place], AGNP-C [phone] ([place])

## 2020-01-28 NOTE — Progress Notes (Signed)
I connected by phone with Bryan Nicholson on 01/28/2020 at 12:00 PM to discuss the potential use of an new treatment for mild to moderate COVID-19 viral infection in non-hospitalized patients.  This patient is a 74 y.o. male that meets the FDA criteria for Emergency Use Authorization of casirivimab\imdevimab.  Has a (+) direct SARS-CoV-2 viral test result  Has mild or moderate COVID-19   Is ? 72 years of age and weighs ? 40 kg  Is NOT hospitalized due to COVID-19  Is NOT requiring oxygen therapy or requiring an increase in baseline oxygen flow rate due to COVID-19  Is within 10 days of symptom onset  Has at least one of the high risk factor(s) for progression to severe COVID-19 and/or hospitalization as defined in EUA.  Specific high risk criteria : Older age (>/= 72 yo)  Patient is vaccinated. Sx onset 01/21/20. Continues to be symptomatic. Has test results.   I have spoken and communicated the following to the patient or parent/caregiver:  1. FDA has authorized the emergency use of casirivimab\imdevimab for the treatment of mild to moderate COVID-19 in adults and pediatric patients with positive results of direct SARS-CoV-2 viral testing who are 52 years of age and older weighing at least 40 kg, and who are at high risk for progressing to severe COVID-19 and/or hospitalization.  2. The significant known and potential risks and benefits of casirivimab\imdevimab, and the extent to which such potential risks and benefits are unknown.  3. Information on available alternative treatments and the risks and benefits of those alternatives, including clinical trials.  4. Patients treated with casirivimab\imdevimab should continue to self-isolate and use infection control measures (e.g., wear mask, isolate, social distance, avoid sharing personal items, clean and disinfect "high touch" surfaces, and frequent handwashing) according to CDC guidelines.   5. The patient or parent/caregiver has the  option to accept or refuse casirivimab\imdevimab .  After reviewing this information with the patient, The patient agreed to proceed with receiving casirivimab\imdevimab infusion and will be provided a copy of the Fact sheet prior to receiving the infusion.Beckey Rutter, [place], AGNP-C [phone] ([place])

## 2020-01-29 ENCOUNTER — Ambulatory Visit (HOSPITAL_COMMUNITY)
Admission: RE | Admit: 2020-01-29 | Discharge: 2020-01-29 | Disposition: A | Discharge status: Home / Self Care | Payer: 59 | Source: Ambulatory Visit | Attending: Pulmonary Disease | Admitting: Pulmonary Disease

## 2020-01-29 DIAGNOSIS — U071 COVID-19: Secondary | ICD-10-CM | POA: Insufficient documentation

## 2020-01-29 MED ORDER — SODIUM CHLORIDE 0.9 % IV SOLN: INTRAVENOUS | Status: DC | PRN

## 2020-01-29 MED ORDER — DIPHENHYDRAMINE HCL 50 MG/ML IJ SOLN: 50.0000 mg | Freq: Once | INTRAMUSCULAR | Status: DC | PRN

## 2020-01-29 MED ORDER — METHYLPREDNISOLONE SODIUM SUCC 125 MG IJ SOLR: 125.0000 mg | Freq: Once | INTRAMUSCULAR | Status: DC | PRN

## 2020-01-29 MED ORDER — EPINEPHRINE 0.3 MG/0.3ML IJ SOAJ: 0.3000 mg | Freq: Once | INTRAMUSCULAR | Status: DC | PRN

## 2020-01-29 MED ORDER — ALBUTEROL SULFATE HFA 108 (90 BASE) MCG/ACT IN AERS: 2.0000 | INHALATION_SPRAY | Freq: Once | RESPIRATORY_TRACT | Status: DC | PRN

## 2020-01-29 MED ORDER — CASIRIVIMAB-IMDEVIMAB 600-600 MG/10ML IJ SOLN
1200.0000 mg | Freq: Once | INTRAMUSCULAR | Status: AC
  Administered 2020-01-29: 1200 mg via INTRAVENOUS
  Filled 2020-01-29: qty 10

## 2020-01-29 MED ORDER — FAMOTIDINE IN NACL 20-0.9 MG/50ML-% IV SOLN: 20.0000 mg | Freq: Once | INTRAVENOUS | Status: DC | PRN

## 2020-01-29 NOTE — Discharge Instructions (Signed)

## 2020-01-29 NOTE — Progress Notes (Signed)
  Diagnosis: COVID-19  Physician:DR P. Joya Gaskins  Procedure: [facility] Med: casirivimab\imdevimab infusion - Provided patient with casirivimab\imdevimab fact sheet for patients, parents and caregivers prior to infusion.  Complications: No immediate complications noted.  Discharge: Discharged home   Carin Hock [facility] 01/29/2020

## 2020-01-31 MED FILL — ATORVASTATIN CALCIUM 40 MG: 40 | 90 days supply | Qty: 90 | Fill #2

## 2020-01-31 MED FILL — FENOFIBRATE 160 MG TABLET: 160 | 90 days supply | Qty: 90 | Fill #2

## 2020-02-11 MED FILL — ATORVASTATIN CALCIUM 40 MG: 40 | 90 days supply | Qty: 90 | Fill #2

## 2020-02-11 MED FILL — FENOFIBRATE 160 MG TABLET: 160 | 90 days supply | Qty: 90 | Fill #2

## 2020-03-16 DIAGNOSIS — M19012 Primary osteoarthritis, left shoulder: Secondary | ICD-10-CM | POA: Diagnosis not present

## 2020-03-16 DIAGNOSIS — M11212 Other chondrocalcinosis, left shoulder: Secondary | ICD-10-CM | POA: Diagnosis not present

## 2020-03-30 DIAGNOSIS — Z01818 Encounter for other preprocedural examination: Secondary | ICD-10-CM | POA: Diagnosis not present

## 2020-03-30 DIAGNOSIS — M19012 Primary osteoarthritis, left shoulder: Secondary | ICD-10-CM | POA: Diagnosis not present

## 2020-04-21 DIAGNOSIS — M19012 Primary osteoarthritis, left shoulder: Secondary | ICD-10-CM | POA: Diagnosis not present

## 2020-04-21 DIAGNOSIS — E785 Hyperlipidemia, unspecified: Secondary | ICD-10-CM | POA: Diagnosis not present

## 2020-04-21 DIAGNOSIS — R799 Abnormal finding of blood chemistry, unspecified: Secondary | ICD-10-CM | POA: Diagnosis not present

## 2020-04-28 DIAGNOSIS — Z471 Aftercare following joint replacement surgery: Secondary | ICD-10-CM | POA: Diagnosis not present

## 2020-04-28 DIAGNOSIS — M19012 Primary osteoarthritis, left shoulder: Secondary | ICD-10-CM | POA: Diagnosis not present

## 2020-04-28 DIAGNOSIS — Z7409 Other reduced mobility: Secondary | ICD-10-CM | POA: Diagnosis not present

## 2020-04-28 DIAGNOSIS — E785 Hyperlipidemia, unspecified: Secondary | ICD-10-CM | POA: Diagnosis not present

## 2020-04-28 DIAGNOSIS — Z7982 Long term (current) use of aspirin: Secondary | ICD-10-CM | POA: Diagnosis not present

## 2020-04-28 DIAGNOSIS — Z79899 Other long term (current) drug therapy: Secondary | ICD-10-CM | POA: Diagnosis not present

## 2020-04-28 DIAGNOSIS — M7522 Bicipital tendinitis, left shoulder: Secondary | ICD-10-CM | POA: Diagnosis not present

## 2020-04-28 DIAGNOSIS — Z96612 Presence of left artificial shoulder joint: Secondary | ICD-10-CM | POA: Diagnosis not present

## 2020-04-28 DIAGNOSIS — N529 Male erectile dysfunction, unspecified: Secondary | ICD-10-CM | POA: Diagnosis not present

## 2020-04-28 DIAGNOSIS — G8918 Other acute postprocedural pain: Secondary | ICD-10-CM | POA: Diagnosis not present

## 2020-04-28 DIAGNOSIS — F419 Anxiety disorder, unspecified: Secondary | ICD-10-CM | POA: Diagnosis not present

## 2020-04-28 DIAGNOSIS — N400 Enlarged prostate without lower urinary tract symptoms: Secondary | ICD-10-CM | POA: Diagnosis not present

## 2020-04-28 DIAGNOSIS — M13812 Other specified arthritis, left shoulder: Secondary | ICD-10-CM | POA: Diagnosis not present

## 2020-04-28 DIAGNOSIS — Z741 Need for assistance with personal care: Secondary | ICD-10-CM | POA: Diagnosis not present

## 2020-04-29 DIAGNOSIS — F419 Anxiety disorder, unspecified: Secondary | ICD-10-CM | POA: Diagnosis not present

## 2020-04-29 DIAGNOSIS — Z741 Need for assistance with personal care: Secondary | ICD-10-CM | POA: Diagnosis not present

## 2020-04-29 DIAGNOSIS — M19012 Primary osteoarthritis, left shoulder: Secondary | ICD-10-CM | POA: Diagnosis not present

## 2020-04-29 DIAGNOSIS — E785 Hyperlipidemia, unspecified: Secondary | ICD-10-CM | POA: Diagnosis not present

## 2020-04-29 DIAGNOSIS — Z7409 Other reduced mobility: Secondary | ICD-10-CM | POA: Diagnosis not present

## 2020-04-29 DIAGNOSIS — N400 Enlarged prostate without lower urinary tract symptoms: Secondary | ICD-10-CM | POA: Diagnosis not present

## 2020-04-29 DIAGNOSIS — N529 Male erectile dysfunction, unspecified: Secondary | ICD-10-CM | POA: Diagnosis not present

## 2020-04-29 DIAGNOSIS — M13812 Other specified arthritis, left shoulder: Secondary | ICD-10-CM | POA: Diagnosis not present

## 2020-04-29 DIAGNOSIS — Z79899 Other long term (current) drug therapy: Secondary | ICD-10-CM | POA: Diagnosis not present

## 2020-04-29 DIAGNOSIS — G8918 Other acute postprocedural pain: Secondary | ICD-10-CM | POA: Diagnosis not present

## 2020-04-29 DIAGNOSIS — Z7982 Long term (current) use of aspirin: Secondary | ICD-10-CM | POA: Diagnosis not present

## 2020-04-29 DIAGNOSIS — M7522 Bicipital tendinitis, left shoulder: Secondary | ICD-10-CM | POA: Diagnosis not present

## 2020-05-04 DIAGNOSIS — M19012 Primary osteoarthritis, left shoulder: Secondary | ICD-10-CM | POA: Diagnosis not present

## 2020-05-04 DIAGNOSIS — Z9889 Other specified postprocedural states: Secondary | ICD-10-CM | POA: Diagnosis not present

## 2020-05-04 DIAGNOSIS — Z96612 Presence of left artificial shoulder joint: Secondary | ICD-10-CM | POA: Diagnosis not present

## 2020-05-04 DIAGNOSIS — Z471 Aftercare following joint replacement surgery: Secondary | ICD-10-CM | POA: Diagnosis not present

## 2020-05-22 DIAGNOSIS — Z23 Encounter for immunization: Secondary | ICD-10-CM | POA: Diagnosis not present

## 2020-05-22 MED FILL — FENOFIBRATE 160 MG TABLET: 160 | 90 days supply | Qty: 90 | Fill #3

## 2020-05-23 ENCOUNTER — Other Ambulatory Visit (HOSPITAL_COMMUNITY): Payer: Self-pay | Admitting: Internal Medicine

## 2020-05-23 MED FILL — ATORVASTATIN CALCIUM 40 MG: 40 | 90 days supply | Qty: 90 | Fill #0

## 2020-06-07 DIAGNOSIS — M19012 Primary osteoarthritis, left shoulder: Secondary | ICD-10-CM | POA: Diagnosis not present

## 2020-06-07 DIAGNOSIS — Z96612 Presence of left artificial shoulder joint: Secondary | ICD-10-CM | POA: Diagnosis not present

## 2020-06-07 DIAGNOSIS — G8929 Other chronic pain: Secondary | ICD-10-CM | POA: Diagnosis not present

## 2020-06-07 DIAGNOSIS — M19019 Primary osteoarthritis, unspecified shoulder: Secondary | ICD-10-CM | POA: Diagnosis not present

## 2020-06-07 DIAGNOSIS — M25512 Pain in left shoulder: Secondary | ICD-10-CM | POA: Diagnosis not present

## 2020-06-08 DIAGNOSIS — M19012 Primary osteoarthritis, left shoulder: Secondary | ICD-10-CM | POA: Diagnosis not present

## 2020-06-08 DIAGNOSIS — Z8781 Personal history of (healed) traumatic fracture: Secondary | ICD-10-CM | POA: Diagnosis not present

## 2020-06-08 DIAGNOSIS — Z471 Aftercare following joint replacement surgery: Secondary | ICD-10-CM | POA: Diagnosis not present

## 2020-06-08 DIAGNOSIS — Z96612 Presence of left artificial shoulder joint: Secondary | ICD-10-CM | POA: Diagnosis not present

## 2020-06-15 DIAGNOSIS — M19019 Primary osteoarthritis, unspecified shoulder: Secondary | ICD-10-CM | POA: Diagnosis not present

## 2020-06-15 DIAGNOSIS — M25512 Pain in left shoulder: Secondary | ICD-10-CM | POA: Diagnosis not present

## 2020-06-15 DIAGNOSIS — Z96612 Presence of left artificial shoulder joint: Secondary | ICD-10-CM | POA: Diagnosis not present

## 2020-06-15 DIAGNOSIS — G8929 Other chronic pain: Secondary | ICD-10-CM | POA: Diagnosis not present

## 2020-06-15 DIAGNOSIS — M19012 Primary osteoarthritis, left shoulder: Secondary | ICD-10-CM | POA: Diagnosis not present

## 2020-06-22 DIAGNOSIS — G8929 Other chronic pain: Secondary | ICD-10-CM | POA: Diagnosis not present

## 2020-06-22 DIAGNOSIS — M25512 Pain in left shoulder: Secondary | ICD-10-CM | POA: Diagnosis not present

## 2020-06-22 DIAGNOSIS — M19019 Primary osteoarthritis, unspecified shoulder: Secondary | ICD-10-CM | POA: Diagnosis not present

## 2020-06-22 DIAGNOSIS — Z96612 Presence of left artificial shoulder joint: Secondary | ICD-10-CM | POA: Diagnosis not present

## 2020-06-22 DIAGNOSIS — M19012 Primary osteoarthritis, left shoulder: Secondary | ICD-10-CM | POA: Diagnosis not present

## 2020-06-29 DIAGNOSIS — M25512 Pain in left shoulder: Secondary | ICD-10-CM | POA: Diagnosis not present

## 2020-06-29 DIAGNOSIS — G8929 Other chronic pain: Secondary | ICD-10-CM | POA: Diagnosis not present

## 2020-06-29 DIAGNOSIS — Z96612 Presence of left artificial shoulder joint: Secondary | ICD-10-CM | POA: Diagnosis not present

## 2020-06-29 DIAGNOSIS — M19019 Primary osteoarthritis, unspecified shoulder: Secondary | ICD-10-CM | POA: Diagnosis not present

## 2020-06-29 DIAGNOSIS — M19012 Primary osteoarthritis, left shoulder: Secondary | ICD-10-CM | POA: Diagnosis not present

## 2020-07-05 DIAGNOSIS — Z96612 Presence of left artificial shoulder joint: Secondary | ICD-10-CM | POA: Diagnosis not present

## 2020-07-05 DIAGNOSIS — G8929 Other chronic pain: Secondary | ICD-10-CM | POA: Diagnosis not present

## 2020-07-05 DIAGNOSIS — M19012 Primary osteoarthritis, left shoulder: Secondary | ICD-10-CM | POA: Diagnosis not present

## 2020-07-05 DIAGNOSIS — M19019 Primary osteoarthritis, unspecified shoulder: Secondary | ICD-10-CM | POA: Diagnosis not present

## 2020-07-05 DIAGNOSIS — M25512 Pain in left shoulder: Secondary | ICD-10-CM | POA: Diagnosis not present

## 2020-07-14 DIAGNOSIS — G8929 Other chronic pain: Secondary | ICD-10-CM | POA: Diagnosis not present

## 2020-07-14 DIAGNOSIS — M25512 Pain in left shoulder: Secondary | ICD-10-CM | POA: Diagnosis not present

## 2020-07-14 DIAGNOSIS — M19012 Primary osteoarthritis, left shoulder: Secondary | ICD-10-CM | POA: Diagnosis not present

## 2020-07-14 DIAGNOSIS — Z96612 Presence of left artificial shoulder joint: Secondary | ICD-10-CM | POA: Diagnosis not present

## 2020-07-14 DIAGNOSIS — M19019 Primary osteoarthritis, unspecified shoulder: Secondary | ICD-10-CM | POA: Diagnosis not present

## 2020-07-19 DIAGNOSIS — Z0000 Encounter for general adult medical examination without abnormal findings: Secondary | ICD-10-CM | POA: Diagnosis not present

## 2020-07-19 DIAGNOSIS — D62 Acute posthemorrhagic anemia: Secondary | ICD-10-CM | POA: Diagnosis not present

## 2020-07-19 DIAGNOSIS — E785 Hyperlipidemia, unspecified: Secondary | ICD-10-CM | POA: Diagnosis not present

## 2020-07-19 DIAGNOSIS — R030 Elevated blood-pressure reading, without diagnosis of hypertension: Secondary | ICD-10-CM | POA: Diagnosis not present

## 2020-07-19 DIAGNOSIS — N401 Enlarged prostate with lower urinary tract symptoms: Secondary | ICD-10-CM | POA: Diagnosis not present

## 2020-07-19 DIAGNOSIS — Z96612 Presence of left artificial shoulder joint: Secondary | ICD-10-CM | POA: Diagnosis not present

## 2020-07-19 DIAGNOSIS — R972 Elevated prostate specific antigen [PSA]: Secondary | ICD-10-CM | POA: Diagnosis not present

## 2020-07-20 DIAGNOSIS — Z471 Aftercare following joint replacement surgery: Secondary | ICD-10-CM | POA: Diagnosis not present

## 2020-07-20 DIAGNOSIS — Z96612 Presence of left artificial shoulder joint: Secondary | ICD-10-CM | POA: Diagnosis not present

## 2020-07-20 DIAGNOSIS — Z4789 Encounter for other orthopedic aftercare: Secondary | ICD-10-CM | POA: Diagnosis not present

## 2020-07-21 DIAGNOSIS — R030 Elevated blood-pressure reading, without diagnosis of hypertension: Secondary | ICD-10-CM | POA: Diagnosis not present

## 2020-07-21 DIAGNOSIS — D500 Iron deficiency anemia secondary to blood loss (chronic): Secondary | ICD-10-CM | POA: Diagnosis not present

## 2020-07-21 DIAGNOSIS — M25512 Pain in left shoulder: Secondary | ICD-10-CM | POA: Diagnosis not present

## 2020-07-21 DIAGNOSIS — E785 Hyperlipidemia, unspecified: Secondary | ICD-10-CM | POA: Diagnosis not present

## 2020-07-21 DIAGNOSIS — M19019 Primary osteoarthritis, unspecified shoulder: Secondary | ICD-10-CM | POA: Diagnosis not present

## 2020-07-21 DIAGNOSIS — Z96612 Presence of left artificial shoulder joint: Secondary | ICD-10-CM | POA: Diagnosis not present

## 2020-07-21 DIAGNOSIS — G8929 Other chronic pain: Secondary | ICD-10-CM | POA: Diagnosis not present

## 2020-07-21 DIAGNOSIS — Z0000 Encounter for general adult medical examination without abnormal findings: Secondary | ICD-10-CM | POA: Diagnosis not present

## 2020-07-21 DIAGNOSIS — M19012 Primary osteoarthritis, left shoulder: Secondary | ICD-10-CM | POA: Diagnosis not present

## 2020-07-28 DIAGNOSIS — M19019 Primary osteoarthritis, unspecified shoulder: Secondary | ICD-10-CM | POA: Diagnosis not present

## 2020-07-28 DIAGNOSIS — Z96612 Presence of left artificial shoulder joint: Secondary | ICD-10-CM | POA: Diagnosis not present

## 2020-07-28 DIAGNOSIS — M25512 Pain in left shoulder: Secondary | ICD-10-CM | POA: Diagnosis not present

## 2020-07-28 DIAGNOSIS — M19012 Primary osteoarthritis, left shoulder: Secondary | ICD-10-CM | POA: Diagnosis not present

## 2020-07-28 DIAGNOSIS — G8929 Other chronic pain: Secondary | ICD-10-CM | POA: Diagnosis not present

## 2020-07-31 DIAGNOSIS — N400 Enlarged prostate without lower urinary tract symptoms: Secondary | ICD-10-CM | POA: Diagnosis not present

## 2020-07-31 DIAGNOSIS — F32A Depression, unspecified: Secondary | ICD-10-CM | POA: Diagnosis not present

## 2020-07-31 DIAGNOSIS — E785 Hyperlipidemia, unspecified: Secondary | ICD-10-CM | POA: Diagnosis not present

## 2020-07-31 DIAGNOSIS — Z96652 Presence of left artificial knee joint: Secondary | ICD-10-CM | POA: Diagnosis not present

## 2020-08-03 DIAGNOSIS — Z96612 Presence of left artificial shoulder joint: Secondary | ICD-10-CM | POA: Diagnosis not present

## 2020-08-03 DIAGNOSIS — M19012 Primary osteoarthritis, left shoulder: Secondary | ICD-10-CM | POA: Diagnosis not present

## 2020-08-03 DIAGNOSIS — M25512 Pain in left shoulder: Secondary | ICD-10-CM | POA: Diagnosis not present

## 2020-08-03 DIAGNOSIS — G8929 Other chronic pain: Secondary | ICD-10-CM | POA: Diagnosis not present

## 2020-08-18 ENCOUNTER — Other Ambulatory Visit (HOSPITAL_BASED_OUTPATIENT_CLINIC_OR_DEPARTMENT_OTHER): Payer: Self-pay | Admitting: Internal Medicine

## 2020-08-18 DIAGNOSIS — M25512 Pain in left shoulder: Secondary | ICD-10-CM | POA: Diagnosis not present

## 2020-08-18 DIAGNOSIS — G8929 Other chronic pain: Secondary | ICD-10-CM | POA: Diagnosis not present

## 2020-08-18 DIAGNOSIS — M19012 Primary osteoarthritis, left shoulder: Secondary | ICD-10-CM | POA: Diagnosis not present

## 2020-08-18 DIAGNOSIS — Z96612 Presence of left artificial shoulder joint: Secondary | ICD-10-CM | POA: Diagnosis not present

## 2020-08-31 DIAGNOSIS — Z96612 Presence of left artificial shoulder joint: Secondary | ICD-10-CM | POA: Diagnosis not present

## 2020-08-31 DIAGNOSIS — M25512 Pain in left shoulder: Secondary | ICD-10-CM | POA: Diagnosis not present

## 2020-08-31 DIAGNOSIS — M19012 Primary osteoarthritis, left shoulder: Secondary | ICD-10-CM | POA: Diagnosis not present

## 2020-08-31 DIAGNOSIS — G8929 Other chronic pain: Secondary | ICD-10-CM | POA: Diagnosis not present

## 2020-09-15 DIAGNOSIS — M19019 Primary osteoarthritis, unspecified shoulder: Secondary | ICD-10-CM | POA: Diagnosis not present

## 2020-09-15 DIAGNOSIS — R29898 Other symptoms and signs involving the musculoskeletal system: Secondary | ICD-10-CM | POA: Diagnosis not present

## 2020-09-15 DIAGNOSIS — M25612 Stiffness of left shoulder, not elsewhere classified: Secondary | ICD-10-CM | POA: Diagnosis not present

## 2020-09-15 DIAGNOSIS — M19012 Primary osteoarthritis, left shoulder: Secondary | ICD-10-CM | POA: Diagnosis not present

## 2020-09-15 DIAGNOSIS — M25512 Pain in left shoulder: Secondary | ICD-10-CM | POA: Diagnosis not present

## 2020-09-15 DIAGNOSIS — Z96612 Presence of left artificial shoulder joint: Secondary | ICD-10-CM | POA: Diagnosis not present

## 2020-09-15 DIAGNOSIS — G8929 Other chronic pain: Secondary | ICD-10-CM | POA: Diagnosis not present

## 2020-09-28 DIAGNOSIS — M19019 Primary osteoarthritis, unspecified shoulder: Secondary | ICD-10-CM | POA: Diagnosis not present

## 2020-09-28 DIAGNOSIS — M25512 Pain in left shoulder: Secondary | ICD-10-CM | POA: Diagnosis not present

## 2020-09-28 DIAGNOSIS — M19012 Primary osteoarthritis, left shoulder: Secondary | ICD-10-CM | POA: Diagnosis not present

## 2020-09-28 DIAGNOSIS — G8929 Other chronic pain: Secondary | ICD-10-CM | POA: Diagnosis not present

## 2020-09-28 DIAGNOSIS — R29898 Other symptoms and signs involving the musculoskeletal system: Secondary | ICD-10-CM | POA: Diagnosis not present

## 2020-09-28 DIAGNOSIS — M25612 Stiffness of left shoulder, not elsewhere classified: Secondary | ICD-10-CM | POA: Diagnosis not present

## 2020-09-28 DIAGNOSIS — Z96612 Presence of left artificial shoulder joint: Secondary | ICD-10-CM | POA: Diagnosis not present

## 2020-10-04 ENCOUNTER — Other Ambulatory Visit (HOSPITAL_BASED_OUTPATIENT_CLINIC_OR_DEPARTMENT_OTHER): Payer: Self-pay

## 2020-10-04 MED ORDER — CARESTART COVID-19 HOME TEST VI KIT
PACK | 0 refills | Status: AC
  Filled 2020-10-04: qty 1, 2d supply, fill #0

## 2020-10-26 DIAGNOSIS — M160 Bilateral primary osteoarthritis of hip: Secondary | ICD-10-CM | POA: Diagnosis not present

## 2020-10-26 DIAGNOSIS — M19012 Primary osteoarthritis, left shoulder: Secondary | ICD-10-CM | POA: Diagnosis not present

## 2020-10-26 DIAGNOSIS — Z96612 Presence of left artificial shoulder joint: Secondary | ICD-10-CM | POA: Diagnosis not present

## 2020-10-26 DIAGNOSIS — M47816 Spondylosis without myelopathy or radiculopathy, lumbar region: Secondary | ICD-10-CM | POA: Diagnosis not present

## 2020-10-26 DIAGNOSIS — M25559 Pain in unspecified hip: Secondary | ICD-10-CM | POA: Diagnosis not present

## 2020-10-26 DIAGNOSIS — M25551 Pain in right hip: Secondary | ICD-10-CM | POA: Diagnosis not present

## 2020-10-26 DIAGNOSIS — Z471 Aftercare following joint replacement surgery: Secondary | ICD-10-CM | POA: Diagnosis not present

## 2020-10-26 DIAGNOSIS — M461 Sacroiliitis, not elsewhere classified: Secondary | ICD-10-CM | POA: Diagnosis not present

## 2020-11-02 DIAGNOSIS — M25559 Pain in unspecified hip: Secondary | ICD-10-CM | POA: Diagnosis not present

## 2020-11-08 DIAGNOSIS — M25559 Pain in unspecified hip: Secondary | ICD-10-CM | POA: Diagnosis not present

## 2020-11-21 ENCOUNTER — Other Ambulatory Visit (HOSPITAL_BASED_OUTPATIENT_CLINIC_OR_DEPARTMENT_OTHER): Payer: Self-pay

## 2020-11-21 MED FILL — Atorvastatin Calcium Tab 40 MG (Base Equivalent): ORAL | 90 days supply | Qty: 90 | Fill #0 | Status: AC

## 2020-11-21 MED FILL — Fenofibrate Tab 160 MG: ORAL | 90 days supply | Qty: 90 | Fill #0 | Status: AC

## 2020-11-22 DIAGNOSIS — M25559 Pain in unspecified hip: Secondary | ICD-10-CM | POA: Diagnosis not present

## 2020-11-27 DIAGNOSIS — M25559 Pain in unspecified hip: Secondary | ICD-10-CM | POA: Diagnosis not present

## 2020-11-28 ENCOUNTER — Other Ambulatory Visit (HOSPITAL_BASED_OUTPATIENT_CLINIC_OR_DEPARTMENT_OTHER): Payer: Self-pay

## 2020-11-28 MED ORDER — PAXLOVID 10 X 150 MG & 10 X 100MG PO TBPK
ORAL_TABLET | ORAL | 0 refills | Status: AC
  Filled 2020-11-28: qty 20, 5d supply, fill #0

## 2020-12-05 DIAGNOSIS — M25559 Pain in unspecified hip: Secondary | ICD-10-CM | POA: Diagnosis not present

## 2021-01-03 DIAGNOSIS — M25559 Pain in unspecified hip: Secondary | ICD-10-CM | POA: Diagnosis not present

## 2021-01-17 DIAGNOSIS — M25559 Pain in unspecified hip: Secondary | ICD-10-CM | POA: Diagnosis not present

## 2021-01-19 ENCOUNTER — Telehealth: Payer: Self-pay | Admitting: Sports Medicine

## 2021-01-19 DIAGNOSIS — M4807 Spinal stenosis, lumbosacral region: Secondary | ICD-10-CM

## 2021-01-19 DIAGNOSIS — M5441 Lumbago with sciatica, right side: Secondary | ICD-10-CM

## 2021-01-19 DIAGNOSIS — M545 Low back pain, unspecified: Secondary | ICD-10-CM | POA: Insufficient documentation

## 2021-01-19 DIAGNOSIS — G8929 Other chronic pain: Secondary | ICD-10-CM | POA: Insufficient documentation

## 2021-01-19 MED ORDER — PREDNISONE 50 MG PO TABS: ORAL_TABLET | ORAL | 0 refills | Status: AC

## 2021-01-19 NOTE — Telephone Encounter (Signed)
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 I do suspect he has lumbar spinal stenosis with right-sided radiculitis with improvement with flexion. Adding x-rays, lumbar spine MRI, additional aggressive formal physical therapy, we can revisit this after about 4 to 6 weeks of physical therapy for discussions of lumbar epidural.

## 2021-01-19 NOTE — Assessment & Plan Note (Signed)
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 I do suspect he has lumbar spinal stenosis with right-sided radiculitis with improvement with flexion. Adding x-rays, lumbar spine MRI, additional aggressive formal physical therapy, we can revisit this after about 4 to 6 weeks of physical therapy for discussions of lumbar epidural.

## 2021-01-21 ENCOUNTER — Ambulatory Visit (INDEPENDENT_AMBULATORY_CARE_PROVIDER_SITE_OTHER): Payer: 59

## 2021-01-21 ENCOUNTER — Other Ambulatory Visit: Payer: Self-pay

## 2021-01-21 DIAGNOSIS — M545 Low back pain, unspecified: Secondary | ICD-10-CM | POA: Diagnosis not present

## 2021-01-21 DIAGNOSIS — M47816 Spondylosis without myelopathy or radiculopathy, lumbar region: Secondary | ICD-10-CM | POA: Diagnosis not present

## 2021-01-21 DIAGNOSIS — M4807 Spinal stenosis, lumbosacral region: Secondary | ICD-10-CM

## 2021-01-21 DIAGNOSIS — G8929 Other chronic pain: Secondary | ICD-10-CM | POA: Diagnosis not present

## 2021-01-21 DIAGNOSIS — M5441 Lumbago with sciatica, right side: Secondary | ICD-10-CM

## 2021-01-21 DIAGNOSIS — M419 Scoliosis, unspecified: Secondary | ICD-10-CM | POA: Diagnosis not present

## 2021-01-21 IMAGING — DX DG LUMBAR SPINE COMPLETE 4+V
5 series · 5 of 5 positions shown · non-contrast
Comparison: None.

CLINICAL DATA: Dull ache to the gluteal area and right leg.

EXAM:
LUMBAR SPINE - COMPLETE 4+ VIEW

[l-spine ap]
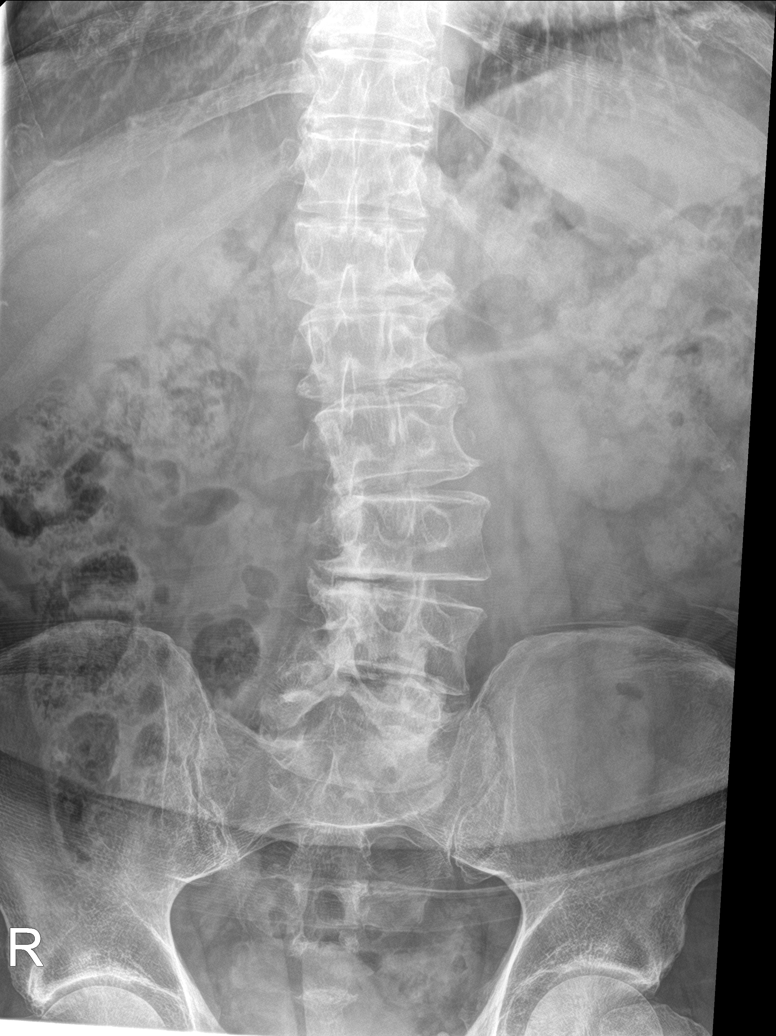

[l-spine obl (1 of 2)]
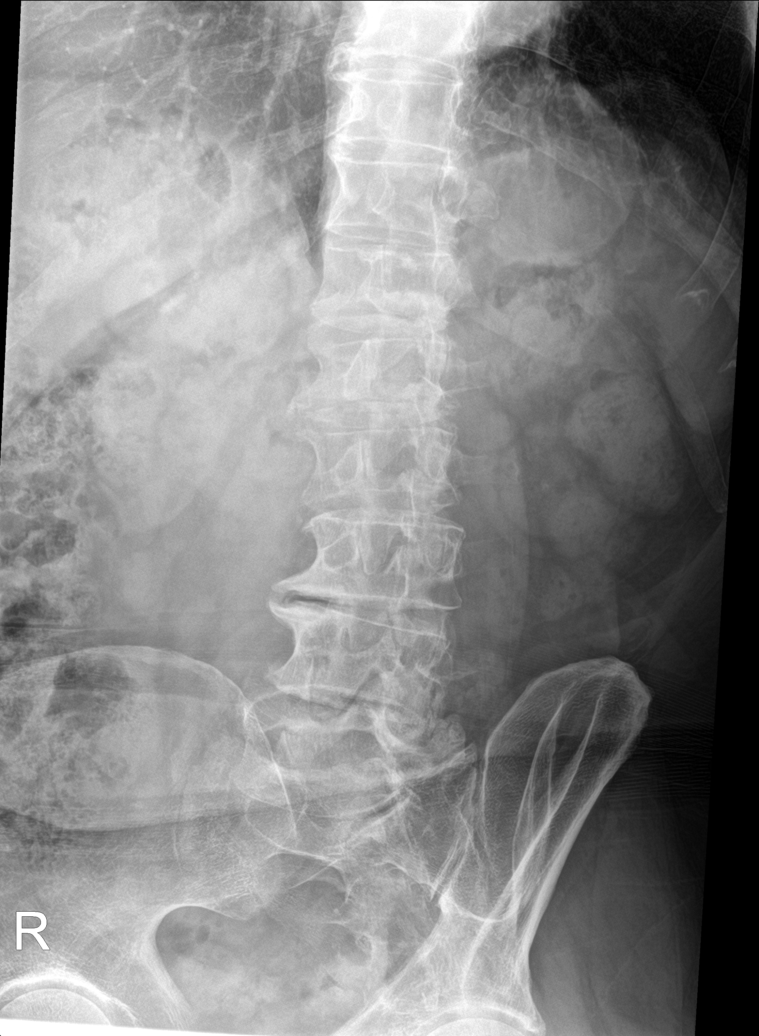

[l-spine obl (2 of 2)]
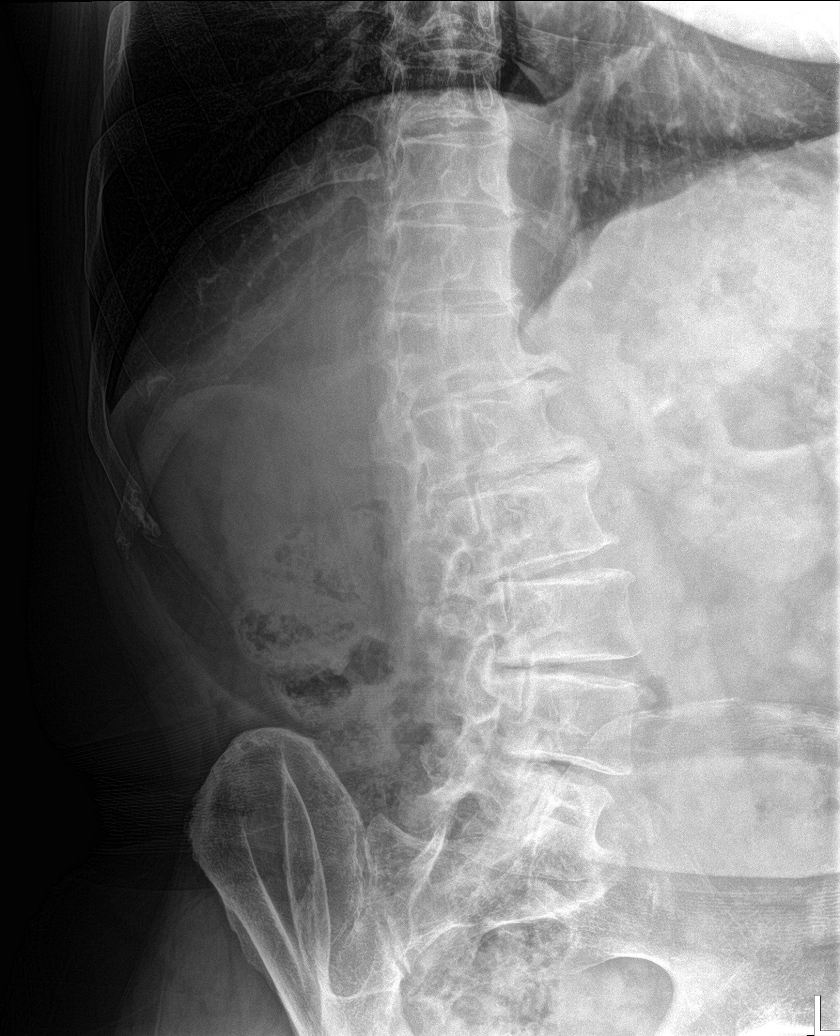

[l-spine lat]
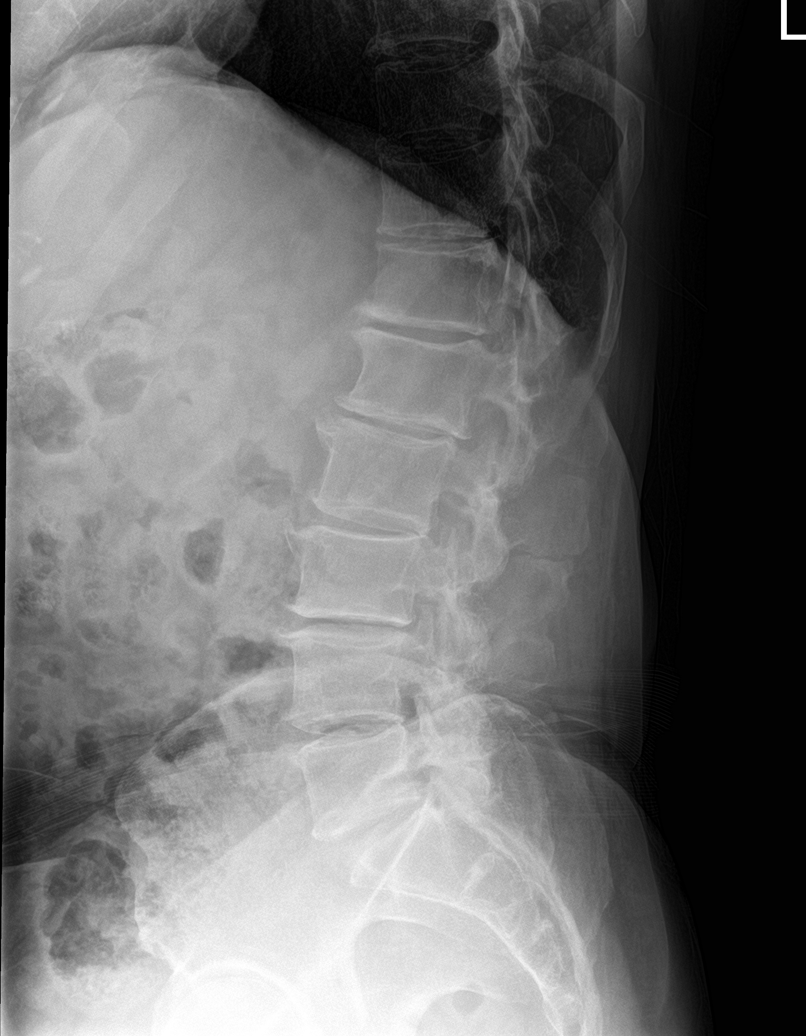

[l-spine spot]
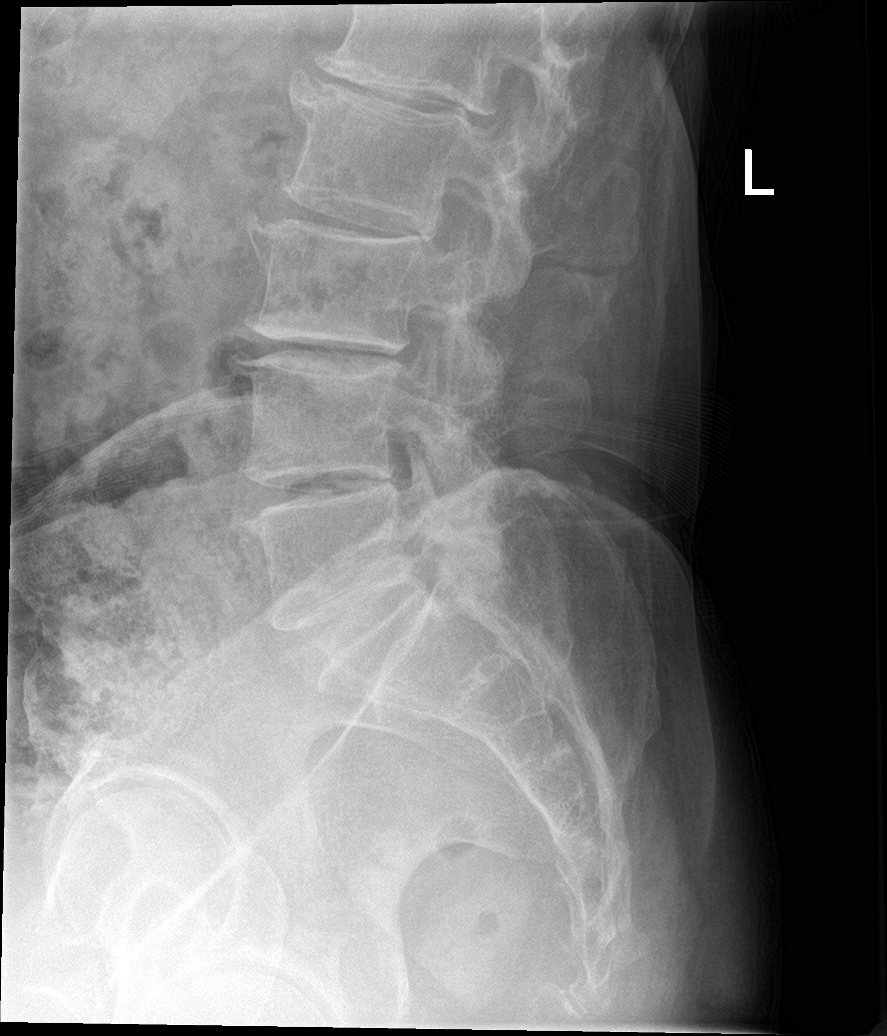

[5 of 5 positions shown; findings below may reference images not displayed]

FINDINGS: Mild lumbar scoliosis convex towards the left. 5 lumbar type
vertebral bodies with atrophic appearance of the twelfth ribs. No
anterior subluxation. No vertebral compression deformities.
Degenerative changes with narrowed interspaces and endplate
osteophyte formation throughout. Degenerative changes in the facet
joints with normal alignment of the facet joints. Degenerative disc
disease at L3-4 and L4-5 levels. Visualized sacrum appears intact.
IMPRESSION: Degenerative changes of the lumbar spine with mild lumbar scoliosis
convex towards the left. No acute displaced fractures identified.

## 2021-01-21 IMAGING — MR MR LUMBAR SPINE W/O CM
4 of 5 series · 24 of 48 positions shown · non-contrast
Comparison: Lumbar radiographs [DATE].

CLINICAL DATA: Chronic bilateral low back pain with right-sided
sciatica M54.41, [9P] ([9P]-CM)

Spinal stenosis of lumbosacral region [9P] ([9P]-CM)
EXAM:
MRI LUMBAR SPINE WITHOUT CONTRAST
TECHNIQUE: Multiplanar, multisequence MR imaging of the lumbar spine was
performed. No intravenous contrast was administered.

[Series 3: T2 · sagittal · 4.0mm · 0.81mm/px · 6 of 17 slices shown (1 of 2)]
[im 1/17]
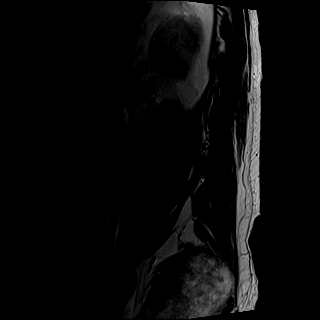
[im 4/17]
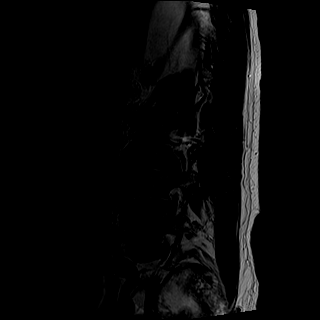
[im 7/17]
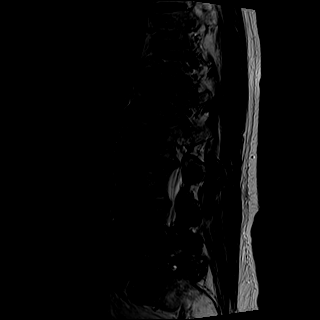
[im 10/17]
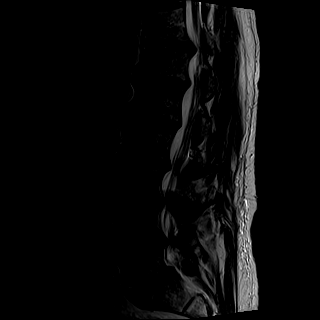
[im 13/17]
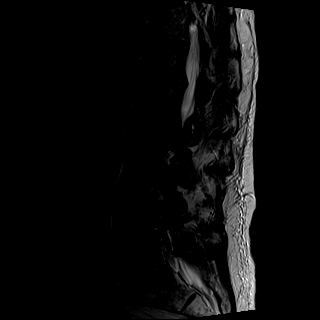
[im 17/17]
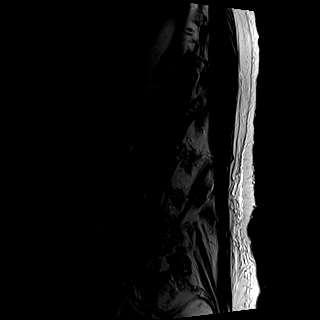

[Series 4: T1 · sagittal · 4.0mm · 0.41mm/px · 6 of 17 slices shown (1 of 2)]
[im 1/17]
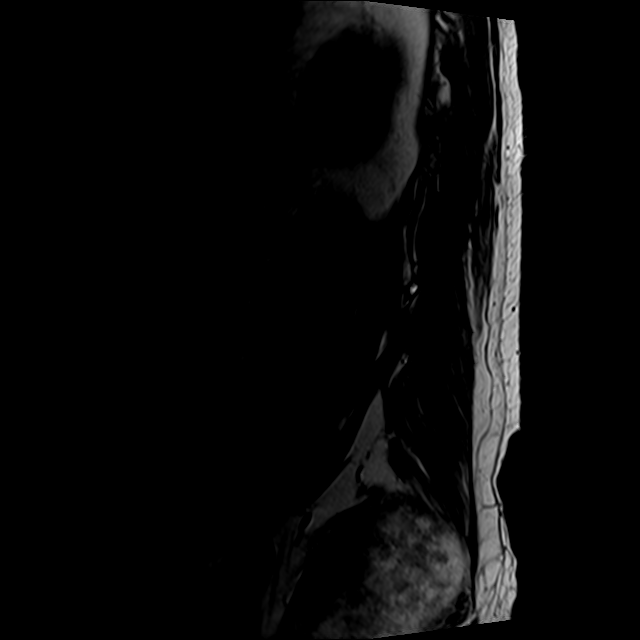
[im 4/17]
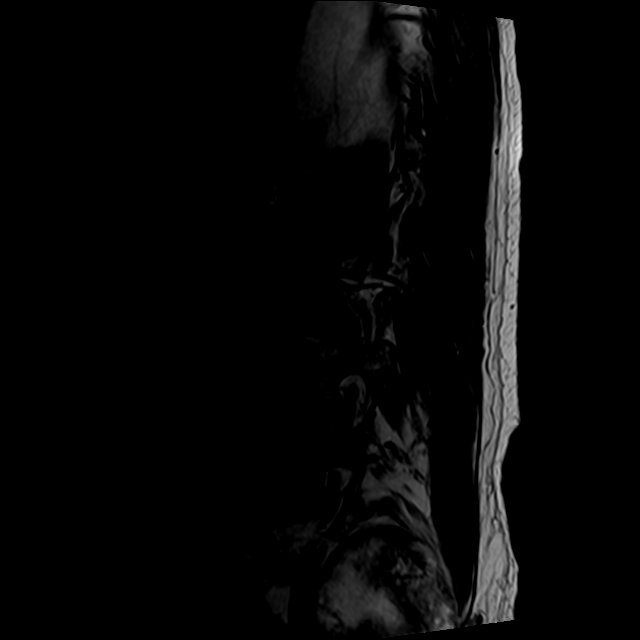
[im 7/17]
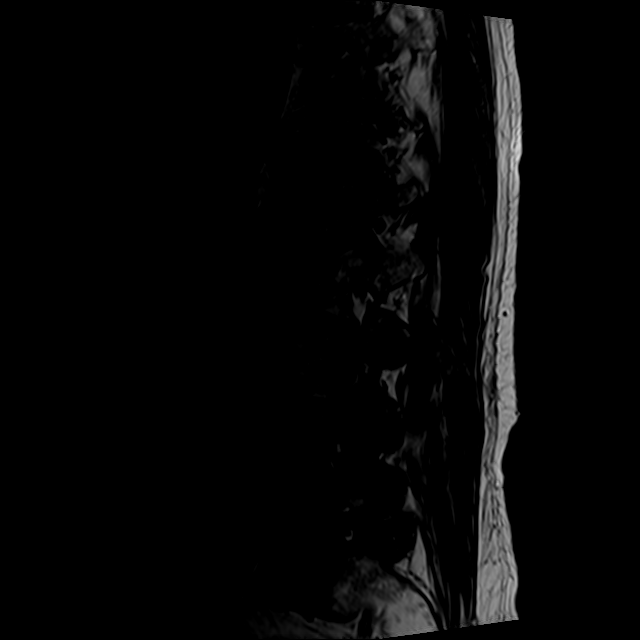
[im 10/17]
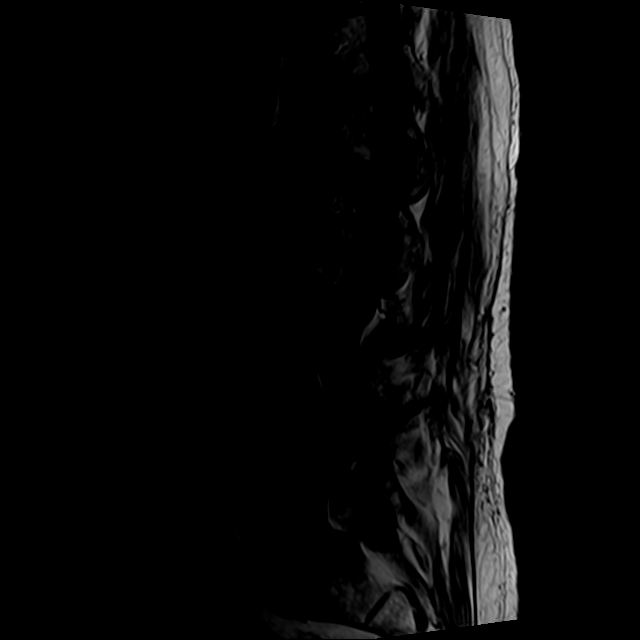
[im 13/17]
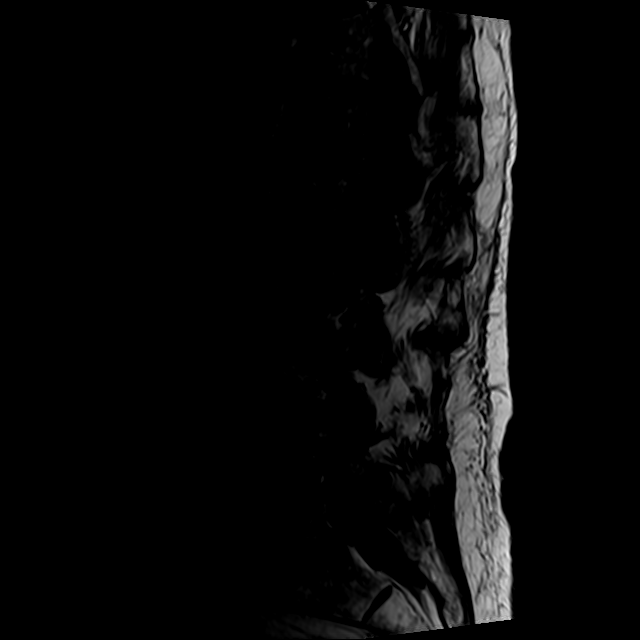
[im 17/17]
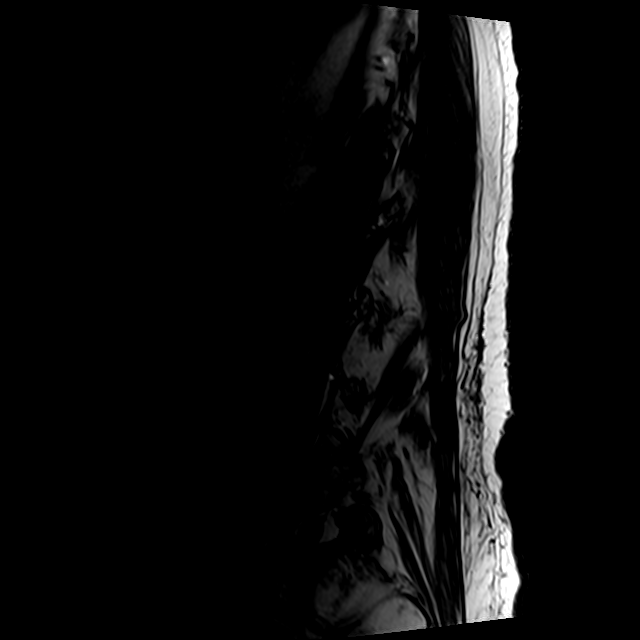

[Series 6: T2 · axial · 4.0mm · 0.78mm/px · z∈[-114,+108]mm · 9 of 45 slices shown (2 of 2)]
[im 1/45]
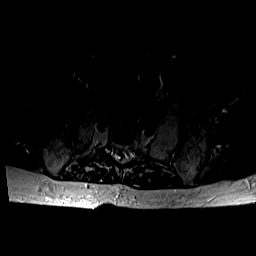
[im 7/45]
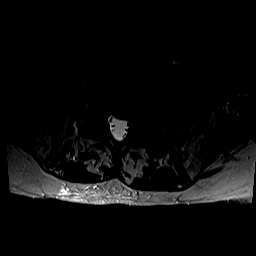
[im 13/45]
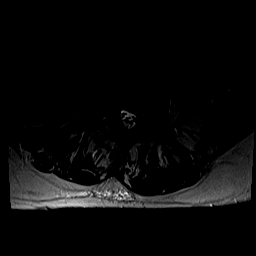
[im 19/45]
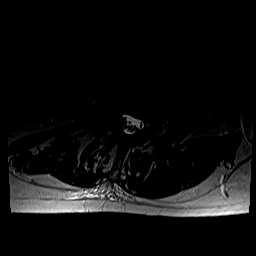
[im 23/45]
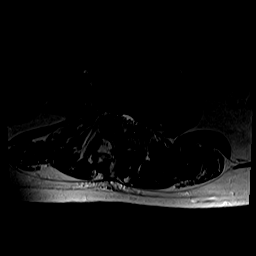
[im 26/45]
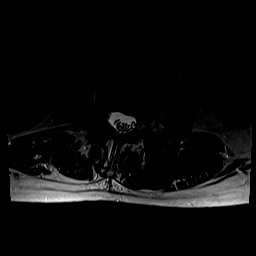
[im 32/45]
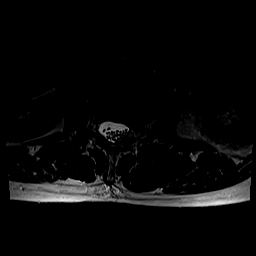
[im 38/45]
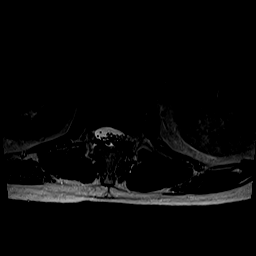
[im 45/45]
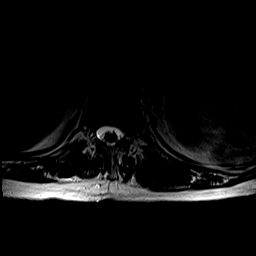

[Series 7: T1 · axial · 4.0mm · 0.39mm/px · z∈[-84,+73]mm · 3 of 45 slices shown (2 of 2)]
[im 7/45]
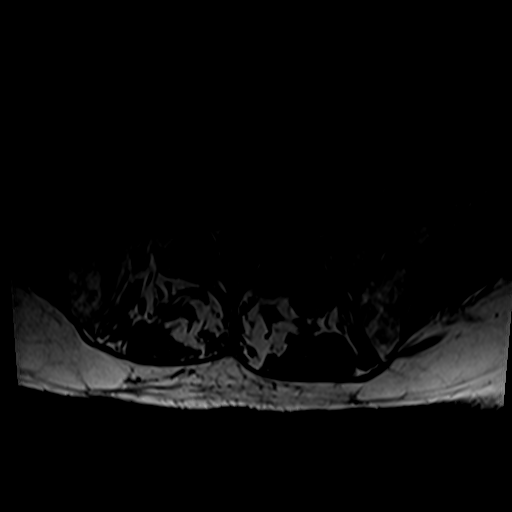
[im 23/45]
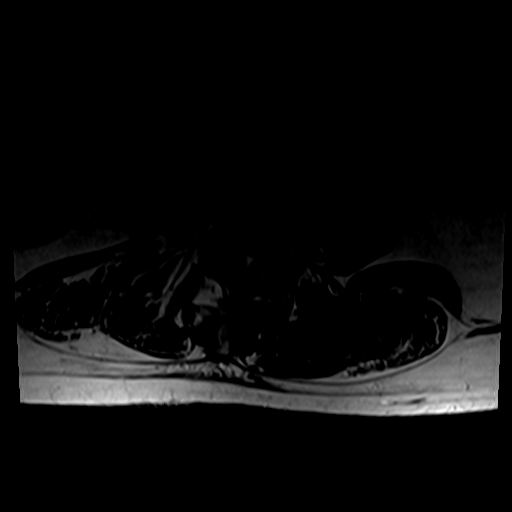
[im 38/45]
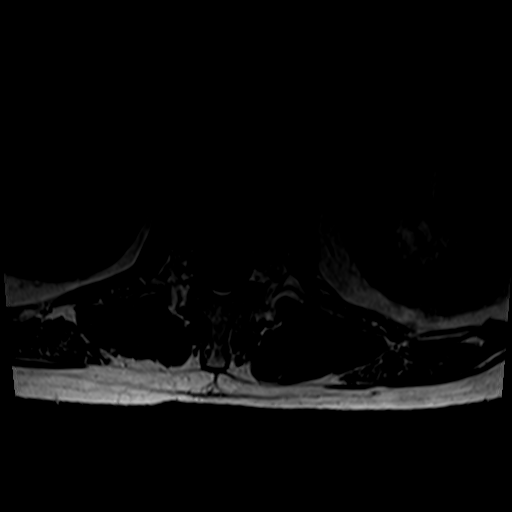

[24 of 48 positions shown; findings below may reference images not displayed]

FINDINGS: Segmentation:  5 lumbar type vertebral bodies.

Alignment: Rotatory levocurvature of the lumbar spine. No
substantial sagittal subluxation.

Vertebrae: Degenerative/discogenic endplate signal changes at T12-L1
anteriorly, L3-L4, and L5-S1. No specific evidence of acute fracture
or discitis/osteomyelitis. Mildly heterogeneous marrow without
suspicious bone lesion.

Conus medullaris and cauda equina: Conus extends to the T12-L1
level. Conus appears normal.

Paraspinal and other soft tissues: Unremarkable.

Disc levels:

T11-T12: Left subarticular disc protrusion which contacts and
flattens the left ventral cord with mild left subarticular recess
stenosis. No significant canal or foraminal stenosis.

T12-L1: Left eccentric posterior disc bulge with mild facet
hypertrophy. Mild left foraminal stenosis without significant canal
or right foraminal stenosis.

L1-L2: Left larger than right foraminal disc protrusions with mild
bilateral facet hypertrophy and ligamentum flavum thickening.
Resulting mild left foraminal stenosis and mild canal stenosis
without significant right foraminal stenosis.

L2-L3: Broad disc bulge with superimposed bilateral foraminal disc
protrusions. Mild right greater than left facet hypertrophy and
ligamentum flavum thickening. Resulting mild right foraminal
stenosis and mild to moderate canal stenosis without significant
left foraminal stenosis.

L3-L4: Right eccentric disc height loss with associated endplate
marrow signal changes. Right eccentric disc bulge with right
foraminal/extraforaminal component. Moderate right and mild left
facet hypertrophy. Ligamentum flavum thickening. Resulting mild to
moderate canal stenosis with mild right subarticular recess
narrowing. Mild right foraminal stenosis. No significant left
foraminal stenosis.

L4-L5: Broad disc bulge with superimposed right foraminal disc
protrusion. Moderate left greater than right facet hypertrophy.
Ligamentum flavum thickening. Resulting moderate to severe right and
moderate left foraminal stenosis with severe canal and bilateral
subarticular recess stenosis.

L5-S1: Left eccentric disc height loss with associated endplate
signal changes. Moderate to severe left and mild right facet
hypertrophy. Resulting moderate to severe left foraminal stenosis.
Mild left subarticular recess stenosis without significant canal or
right foraminal stenosis.
IMPRESSION: 1. At L4-L5, severe canal and bilateral subarticular recess stenosis
with moderate to severe right and moderate left foraminal stenosis.
2. At L5-S1, moderate to severe left foraminal stenosis.
3. At L2-L3 and L3-L4, mild-to-moderate canal stenosis and mild
right foraminal stenosis.
4. At L1-L2, mild canal and left foraminal stenosis.
5. Rotatory levocurvature.

## 2021-02-15 DIAGNOSIS — G8929 Other chronic pain: Secondary | ICD-10-CM | POA: Diagnosis not present

## 2021-02-15 DIAGNOSIS — M25559 Pain in unspecified hip: Secondary | ICD-10-CM | POA: Diagnosis not present

## 2021-02-15 DIAGNOSIS — M5441 Lumbago with sciatica, right side: Secondary | ICD-10-CM | POA: Diagnosis not present

## 2021-03-01 DIAGNOSIS — M5441 Lumbago with sciatica, right side: Secondary | ICD-10-CM | POA: Diagnosis not present

## 2021-03-01 DIAGNOSIS — M25559 Pain in unspecified hip: Secondary | ICD-10-CM | POA: Diagnosis not present

## 2021-03-01 DIAGNOSIS — G8929 Other chronic pain: Secondary | ICD-10-CM | POA: Diagnosis not present

## 2021-03-15 DIAGNOSIS — G8929 Other chronic pain: Secondary | ICD-10-CM | POA: Diagnosis not present

## 2021-03-15 DIAGNOSIS — M25559 Pain in unspecified hip: Secondary | ICD-10-CM | POA: Diagnosis not present

## 2021-03-15 DIAGNOSIS — M5441 Lumbago with sciatica, right side: Secondary | ICD-10-CM | POA: Diagnosis not present

## 2021-03-19 ENCOUNTER — Other Ambulatory Visit (HOSPITAL_BASED_OUTPATIENT_CLINIC_OR_DEPARTMENT_OTHER): Payer: Self-pay

## 2021-03-19 MED FILL — Fenofibrate Tab 160 MG: ORAL | 90 days supply | Qty: 90 | Fill #1 | Status: AC

## 2021-03-20 ENCOUNTER — Other Ambulatory Visit (HOSPITAL_BASED_OUTPATIENT_CLINIC_OR_DEPARTMENT_OTHER): Payer: Self-pay

## 2021-03-23 ENCOUNTER — Other Ambulatory Visit (HOSPITAL_BASED_OUTPATIENT_CLINIC_OR_DEPARTMENT_OTHER): Payer: Self-pay

## 2021-03-26 ENCOUNTER — Other Ambulatory Visit (HOSPITAL_BASED_OUTPATIENT_CLINIC_OR_DEPARTMENT_OTHER): Payer: Self-pay

## 2021-03-26 MED ORDER — ATORVASTATIN CALCIUM 40 MG PO TABS
40.0000 mg | ORAL_TABLET | Freq: Every day | ORAL | 2 refills | Status: DC
  Filled 2021-03-26: qty 90, 90d supply, fill #0
  Filled 2021-06-18: qty 90, 90d supply, fill #1
  Filled 2021-09-24: qty 90, 90d supply, fill #2

## 2021-03-28 DIAGNOSIS — M25559 Pain in unspecified hip: Secondary | ICD-10-CM | POA: Diagnosis not present

## 2021-03-28 DIAGNOSIS — G8929 Other chronic pain: Secondary | ICD-10-CM | POA: Diagnosis not present

## 2021-03-28 DIAGNOSIS — M5441 Lumbago with sciatica, right side: Secondary | ICD-10-CM | POA: Diagnosis not present

## 2021-03-29 ENCOUNTER — Other Ambulatory Visit (HOSPITAL_BASED_OUTPATIENT_CLINIC_OR_DEPARTMENT_OTHER): Payer: Self-pay

## 2021-03-29 MED ORDER — ATORVASTATIN CALCIUM 40 MG PO TABS
40.0000 mg | ORAL_TABLET | Freq: Every day | ORAL | 2 refills | Status: AC
  Filled 2021-03-29: qty 90, 90d supply, fill #0

## 2021-04-11 DIAGNOSIS — M5441 Lumbago with sciatica, right side: Secondary | ICD-10-CM | POA: Diagnosis not present

## 2021-04-11 DIAGNOSIS — G8929 Other chronic pain: Secondary | ICD-10-CM | POA: Diagnosis not present

## 2021-04-11 DIAGNOSIS — M25559 Pain in unspecified hip: Secondary | ICD-10-CM | POA: Diagnosis not present

## 2021-04-18 DIAGNOSIS — R972 Elevated prostate specific antigen [PSA]: Secondary | ICD-10-CM | POA: Diagnosis not present

## 2021-04-18 DIAGNOSIS — N400 Enlarged prostate without lower urinary tract symptoms: Secondary | ICD-10-CM | POA: Diagnosis not present

## 2021-04-18 DIAGNOSIS — N529 Male erectile dysfunction, unspecified: Secondary | ICD-10-CM | POA: Diagnosis not present

## 2021-05-03 DIAGNOSIS — M25512 Pain in left shoulder: Secondary | ICD-10-CM | POA: Diagnosis not present

## 2021-05-03 DIAGNOSIS — Z96612 Presence of left artificial shoulder joint: Secondary | ICD-10-CM | POA: Diagnosis not present

## 2021-05-03 DIAGNOSIS — M19012 Primary osteoarthritis, left shoulder: Secondary | ICD-10-CM | POA: Diagnosis not present

## 2021-05-03 DIAGNOSIS — G8929 Other chronic pain: Secondary | ICD-10-CM | POA: Diagnosis not present

## 2021-05-03 DIAGNOSIS — M25551 Pain in right hip: Secondary | ICD-10-CM | POA: Diagnosis not present

## 2021-05-03 DIAGNOSIS — M5441 Lumbago with sciatica, right side: Secondary | ICD-10-CM | POA: Diagnosis not present

## 2021-05-04 ENCOUNTER — Other Ambulatory Visit (HOSPITAL_BASED_OUTPATIENT_CLINIC_OR_DEPARTMENT_OTHER): Payer: Self-pay

## 2021-05-15 DIAGNOSIS — M19012 Primary osteoarthritis, left shoulder: Secondary | ICD-10-CM | POA: Diagnosis not present

## 2021-05-15 DIAGNOSIS — M25512 Pain in left shoulder: Secondary | ICD-10-CM | POA: Diagnosis not present

## 2021-05-15 DIAGNOSIS — M25551 Pain in right hip: Secondary | ICD-10-CM | POA: Diagnosis not present

## 2021-05-15 DIAGNOSIS — Z96612 Presence of left artificial shoulder joint: Secondary | ICD-10-CM | POA: Diagnosis not present

## 2021-05-15 DIAGNOSIS — M5441 Lumbago with sciatica, right side: Secondary | ICD-10-CM | POA: Diagnosis not present

## 2021-05-15 DIAGNOSIS — G8929 Other chronic pain: Secondary | ICD-10-CM | POA: Diagnosis not present

## 2021-05-17 DIAGNOSIS — N400 Enlarged prostate without lower urinary tract symptoms: Secondary | ICD-10-CM | POA: Diagnosis not present

## 2021-05-17 DIAGNOSIS — R972 Elevated prostate specific antigen [PSA]: Secondary | ICD-10-CM | POA: Diagnosis not present

## 2021-06-18 ENCOUNTER — Other Ambulatory Visit (HOSPITAL_BASED_OUTPATIENT_CLINIC_OR_DEPARTMENT_OTHER): Payer: Self-pay

## 2021-06-18 MED FILL — Fenofibrate Tab 160 MG: ORAL | 90 days supply | Qty: 90 | Fill #2 | Status: AC

## 2021-07-20 DIAGNOSIS — Z0001 Encounter for general adult medical examination with abnormal findings: Secondary | ICD-10-CM | POA: Diagnosis not present

## 2021-07-20 DIAGNOSIS — Z96612 Presence of left artificial shoulder joint: Secondary | ICD-10-CM | POA: Diagnosis not present

## 2021-07-20 DIAGNOSIS — Z0000 Encounter for general adult medical examination without abnormal findings: Secondary | ICD-10-CM | POA: Diagnosis not present

## 2021-07-20 DIAGNOSIS — Z79899 Other long term (current) drug therapy: Secondary | ICD-10-CM | POA: Diagnosis not present

## 2021-07-20 DIAGNOSIS — M19012 Primary osteoarthritis, left shoulder: Secondary | ICD-10-CM | POA: Diagnosis not present

## 2021-07-20 DIAGNOSIS — M48061 Spinal stenosis, lumbar region without neurogenic claudication: Secondary | ICD-10-CM | POA: Diagnosis not present

## 2021-07-20 DIAGNOSIS — Z96652 Presence of left artificial knee joint: Secondary | ICD-10-CM | POA: Diagnosis not present

## 2021-07-20 DIAGNOSIS — N400 Enlarged prostate without lower urinary tract symptoms: Secondary | ICD-10-CM | POA: Diagnosis not present

## 2021-07-20 DIAGNOSIS — E785 Hyperlipidemia, unspecified: Secondary | ICD-10-CM | POA: Diagnosis not present

## 2021-07-20 DIAGNOSIS — R972 Elevated prostate specific antigen [PSA]: Secondary | ICD-10-CM | POA: Diagnosis not present

## 2021-07-20 DIAGNOSIS — R030 Elevated blood-pressure reading, without diagnosis of hypertension: Secondary | ICD-10-CM | POA: Diagnosis not present

## 2021-07-26 DIAGNOSIS — Z96652 Presence of left artificial knee joint: Secondary | ICD-10-CM | POA: Diagnosis not present

## 2021-07-26 DIAGNOSIS — M479 Spondylosis, unspecified: Secondary | ICD-10-CM | POA: Diagnosis not present

## 2021-07-26 DIAGNOSIS — N400 Enlarged prostate without lower urinary tract symptoms: Secondary | ICD-10-CM | POA: Diagnosis not present

## 2021-07-26 DIAGNOSIS — E785 Hyperlipidemia, unspecified: Secondary | ICD-10-CM | POA: Diagnosis not present

## 2021-08-24 DIAGNOSIS — M8580 Other specified disorders of bone density and structure, unspecified site: Secondary | ICD-10-CM | POA: Diagnosis not present

## 2021-09-24 ENCOUNTER — Other Ambulatory Visit (HOSPITAL_BASED_OUTPATIENT_CLINIC_OR_DEPARTMENT_OTHER): Payer: Self-pay

## 2021-09-25 ENCOUNTER — Other Ambulatory Visit (HOSPITAL_BASED_OUTPATIENT_CLINIC_OR_DEPARTMENT_OTHER): Payer: Self-pay

## 2021-09-28 ENCOUNTER — Other Ambulatory Visit (HOSPITAL_BASED_OUTPATIENT_CLINIC_OR_DEPARTMENT_OTHER): Payer: Self-pay

## 2021-10-05 ENCOUNTER — Other Ambulatory Visit (HOSPITAL_BASED_OUTPATIENT_CLINIC_OR_DEPARTMENT_OTHER): Payer: Self-pay

## 2021-10-05 MED ORDER — FENOFIBRATE 160 MG PO TABS
160.0000 mg | ORAL_TABLET | Freq: Every day | ORAL | 3 refills | Status: DC
  Filled 2021-10-05: qty 90, 90d supply, fill #0
  Filled 2022-01-01: qty 90, 90d supply, fill #1
  Filled 2022-04-09: qty 90, 90d supply, fill #2
  Filled 2022-07-15: qty 90, 90d supply, fill #3

## 2021-10-09 ENCOUNTER — Ambulatory Visit: Payer: 59 | Attending: Internal Medicine

## 2021-10-09 DIAGNOSIS — Z23 Encounter for immunization: Secondary | ICD-10-CM

## 2021-10-09 NOTE — Progress Notes (Signed)
? ?  Covid-[facility] Vaccination Clinic ? ?Name:  Bryan Nicholson    ?MRN: [id number] ?DOB: 27-Dec-1945 ? ?10/09/2021 ? ?[name] was observed post Covid-19 immunization for 15 minutes without incident. He was provided with Vaccine Information Sheet and instruction to access the V-Safe system.  ? ?[name] was instructed to call [phone] with any severe reactions post vaccine: ?Difficulty breathing  ?Swelling of face and throat  ?A fast heartbeat  ?A bad rash all over body  ?Dizziness and weakness  ? ?Immunizations Administered   ? ? Name Date Dose VIS Date Route  ? Ambulance person Booster 10/09/2021  3:06 PM 0.3 mL 01/31/2021 Intramuscular  ? Manufacturer: [place]: [phone]  ? [place]: [phone]  ? ?  ? ? ?

## 2021-10-11 ENCOUNTER — Other Ambulatory Visit (HOSPITAL_BASED_OUTPATIENT_CLINIC_OR_DEPARTMENT_OTHER): Payer: Self-pay

## 2021-10-11 MED ORDER — PFIZER COVID-19 VAC BIVALENT 30 MCG/0.3ML IM SUSP
INTRAMUSCULAR | 0 refills | Status: AC
Start: 2021-10-09 — End: ?
  Filled 2021-10-11: qty 0.3, 1d supply, fill #0

## 2021-10-23 DIAGNOSIS — M8580 Other specified disorders of bone density and structure, unspecified site: Secondary | ICD-10-CM | POA: Diagnosis not present

## 2021-10-25 DIAGNOSIS — M8589 Other specified disorders of bone density and structure, multiple sites: Secondary | ICD-10-CM | POA: Diagnosis not present

## 2021-10-25 DIAGNOSIS — R293 Abnormal posture: Secondary | ICD-10-CM | POA: Diagnosis not present

## 2021-10-25 DIAGNOSIS — Z8262 Family history of osteoporosis: Secondary | ICD-10-CM | POA: Diagnosis not present

## 2021-10-25 DIAGNOSIS — M4800 Spinal stenosis, site unspecified: Secondary | ICD-10-CM | POA: Diagnosis not present

## 2021-10-25 DIAGNOSIS — M1712 Unilateral primary osteoarthritis, left knee: Secondary | ICD-10-CM | POA: Diagnosis not present

## 2021-10-25 DIAGNOSIS — R778 Other specified abnormalities of plasma proteins: Secondary | ICD-10-CM | POA: Diagnosis not present

## 2021-10-25 DIAGNOSIS — Z96652 Presence of left artificial knee joint: Secondary | ICD-10-CM | POA: Diagnosis not present

## 2021-10-25 DIAGNOSIS — Z96612 Presence of left artificial shoulder joint: Secondary | ICD-10-CM | POA: Diagnosis not present

## 2021-11-15 ENCOUNTER — Other Ambulatory Visit: Payer: Self-pay | Admitting: Neurological Surgery

## 2021-11-15 DIAGNOSIS — M5416 Radiculopathy, lumbar region: Secondary | ICD-10-CM

## 2021-11-15 DIAGNOSIS — Z6827 Body mass index (BMI) 27.0-27.9, adult: Secondary | ICD-10-CM | POA: Diagnosis not present

## 2021-11-18 ENCOUNTER — Ambulatory Visit (INDEPENDENT_AMBULATORY_CARE_PROVIDER_SITE_OTHER): Payer: 59

## 2021-11-18 DIAGNOSIS — M545 Low back pain, unspecified: Secondary | ICD-10-CM | POA: Diagnosis not present

## 2021-11-18 DIAGNOSIS — M5416 Radiculopathy, lumbar region: Secondary | ICD-10-CM | POA: Diagnosis not present

## 2021-11-18 IMAGING — MR MR LUMBAR SPINE W/O CM
4 of 5 series · 25 of 48 positions shown · non-contrast
Comparison: [DATE]

CLINICAL DATA: Pain in the lower right back for 1 year

EXAM:
MRI LUMBAR SPINE WITHOUT CONTRAST
TECHNIQUE: Multiplanar, multisequence MR imaging of the lumbar spine was
performed. No intravenous contrast was administered.

[Series 3: T2 · sagittal · 4.0mm · 0.81mm/px · 6 of 17 slices shown (1 of 2)]
[im 1/17]
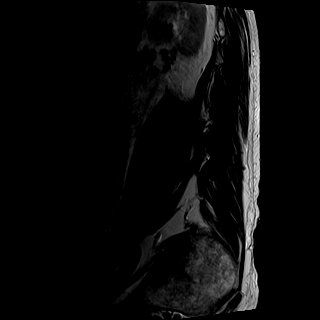
[im 4/17]
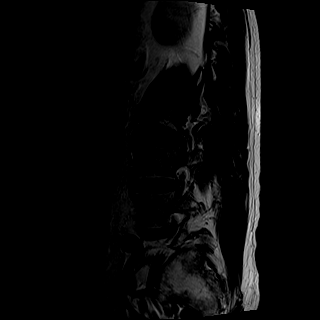
[im 7/17]
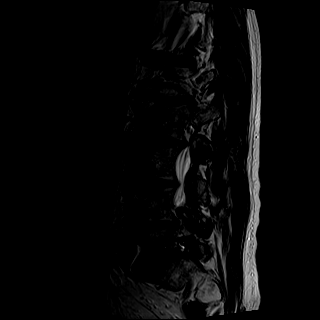
[im 10/17]
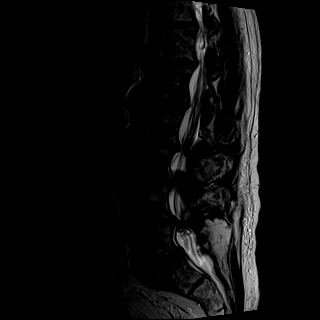
[im 13/17]
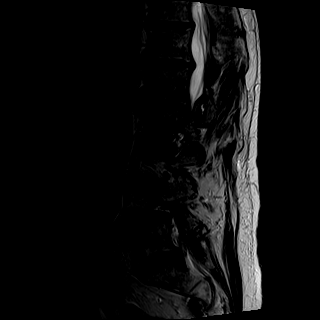
[im 17/17]
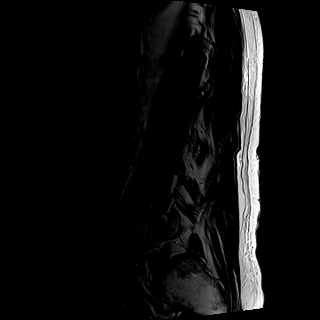

[Series 4: T1 · sagittal · 4.0mm · 0.41mm/px · 6 of 17 slices shown (1 of 2)]
[im 1/17]
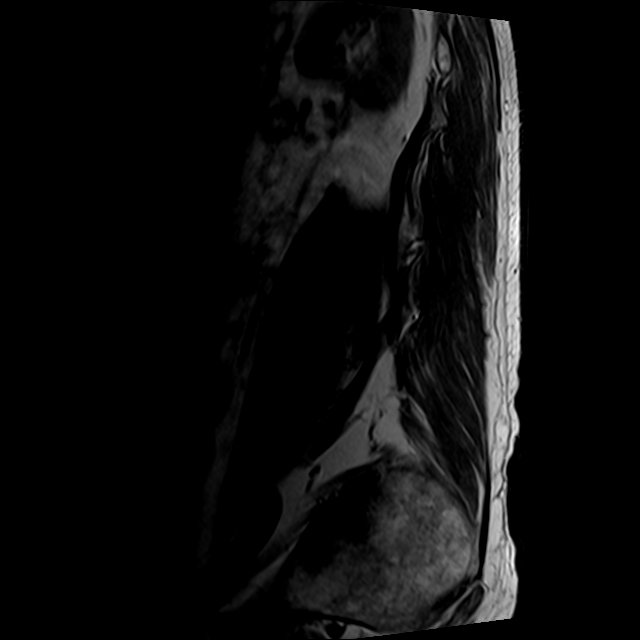
[im 4/17]
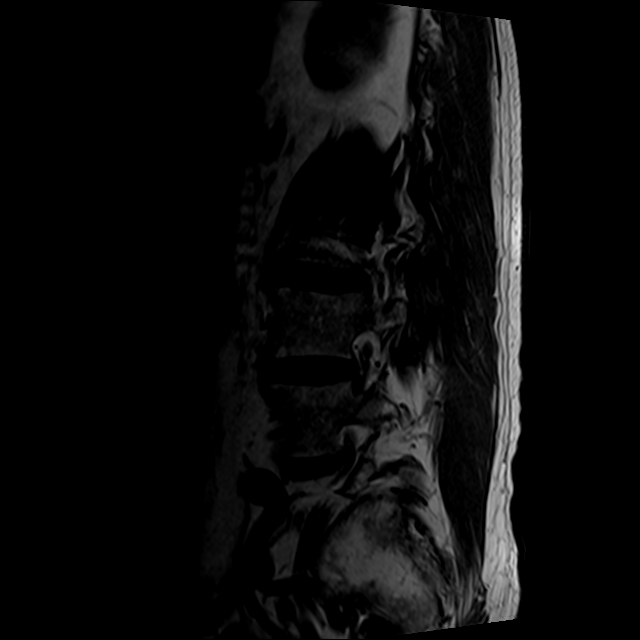
[im 7/17]
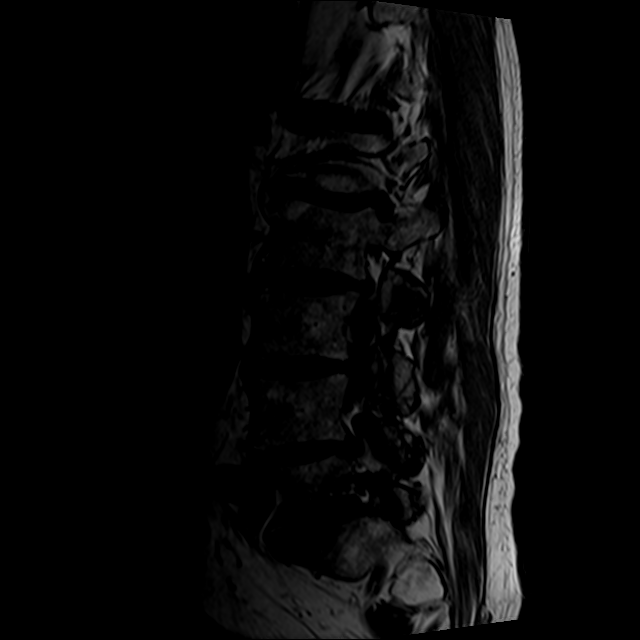
[im 10/17]
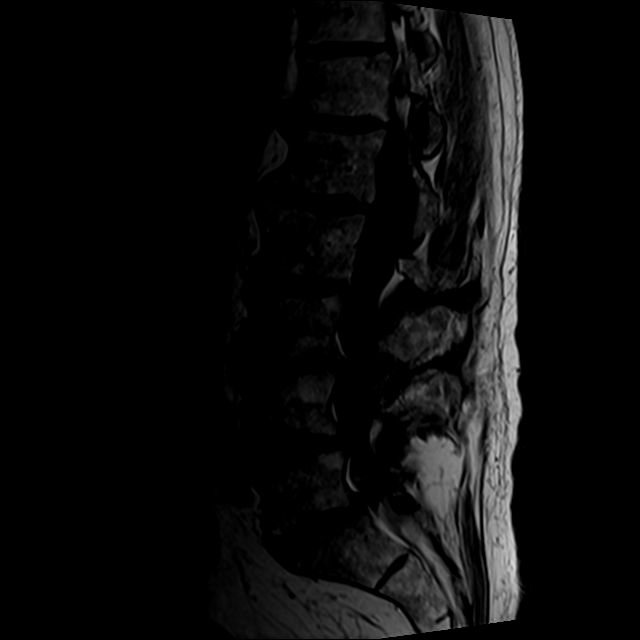
[im 13/17]
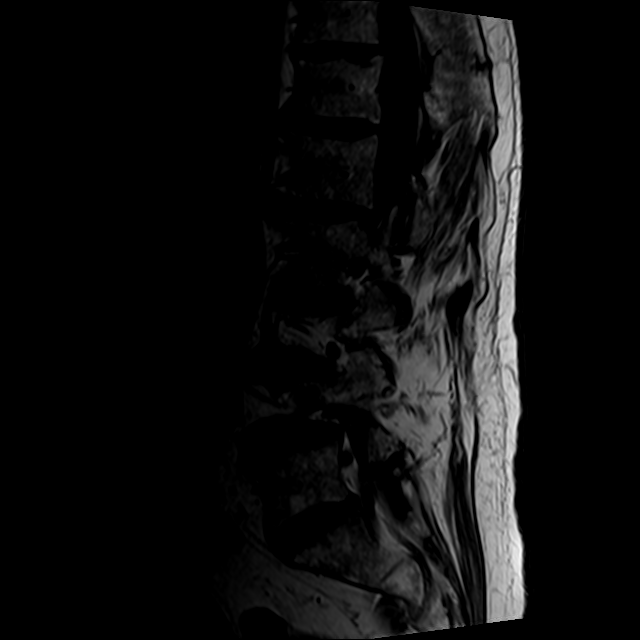
[im 17/17]
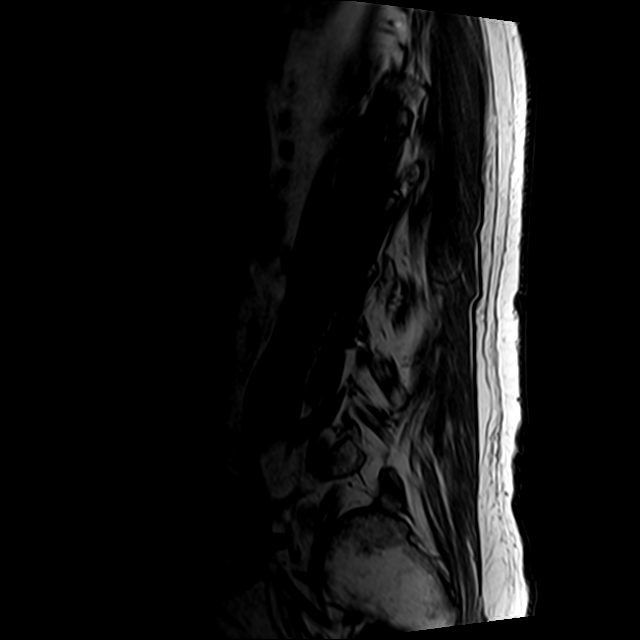

[Series 6: T2 · axial · 4.0mm · 0.78mm/px · z∈[-30,+170]mm · 9 of 39 slices shown (2 of 2)]
[im 1/39]
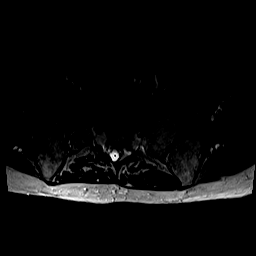
[im 6/39]
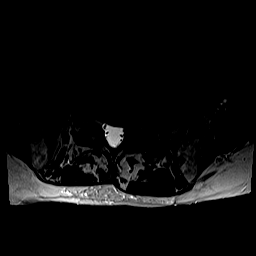
[im 11/39]
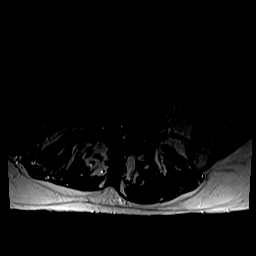
[im 17/39]
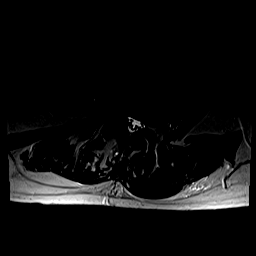
[im 20/39]
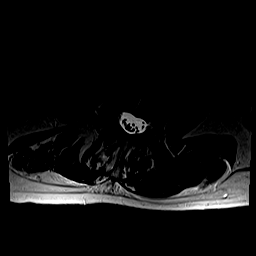
[im 22/39]
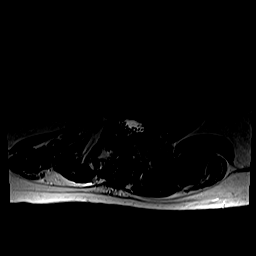
[im 28/39]
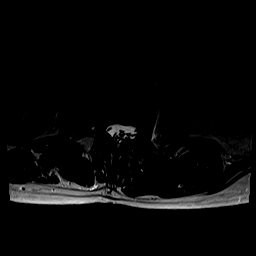
[im 33/39]
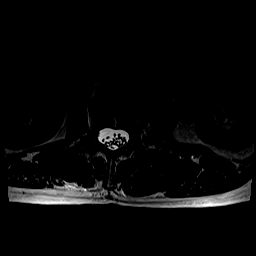
[im 39/39]
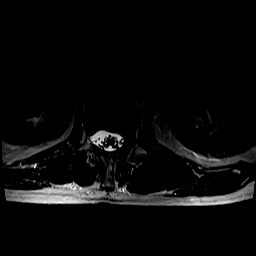

[Series 7: T1 · axial · 4.0mm · 0.39mm/px · z∈[-30,+140]mm · 4 of 39 slices shown (2 of 2)]
[im 1/39]
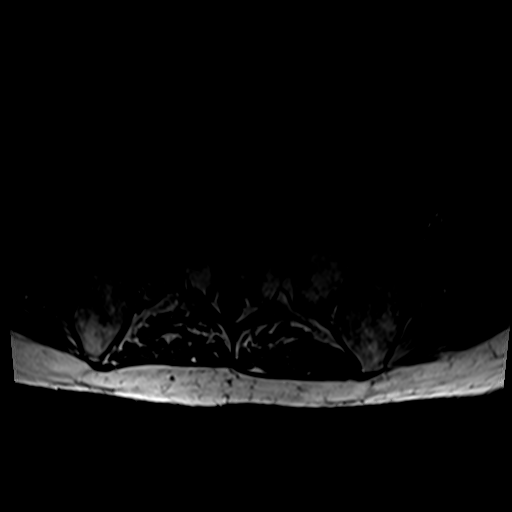
[im 6/39]
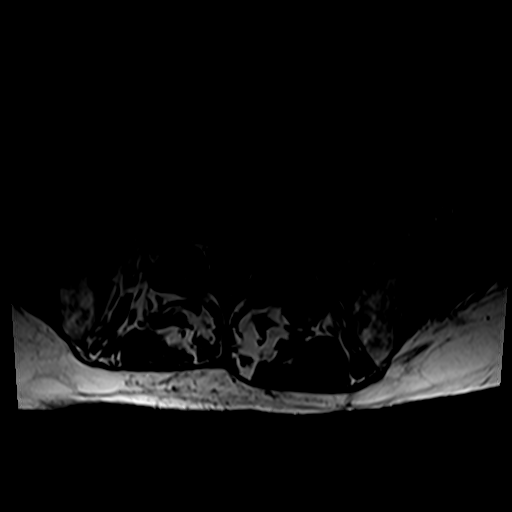
[im 20/39]
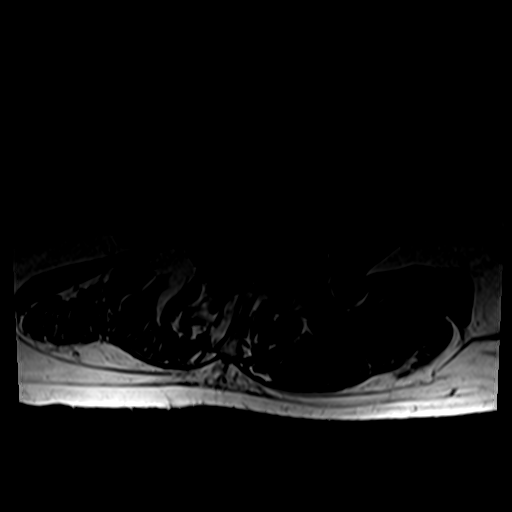
[im 33/39]
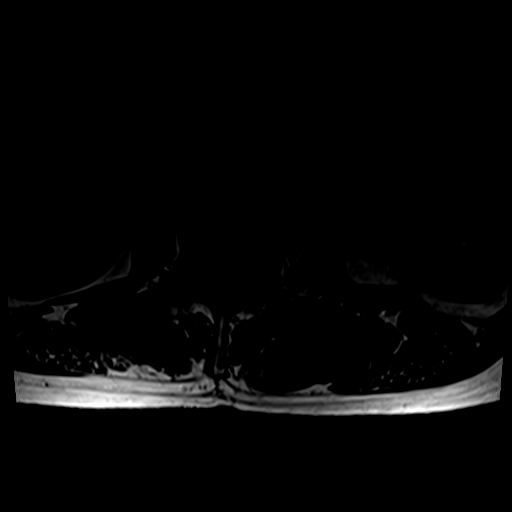

[25 of 48 positions shown; findings below may reference images not displayed]

FINDINGS: Segmentation:  5 lumbar type vertebrae

Alignment:  Levoscoliosis and mild L4-5, L5-S1 anterolisthesis

Vertebrae: No fracture, evidence of discitis, or bone lesion.
Stable, mild and benign heterogeneity of marrow.

Conus medullaris and cauda equina: Conus extends to the T12-L1
level. Conus and cauda equina appear normal.

Paraspinal and other soft tissues: Negative for perispinal mass or
inflammation

Disc levels:

T11-12: Left paracentral protrusion only covered on sagittal images,
chronic. No cord compression

T12- L1: Disc narrowing and bulging with endplate ridging eccentric
to the left.

L1-L2: Disc narrowing and bulging with endplate ridging. Left
inferior foraminal protrusion. Patent canal and foramina

L2-L3: Disc narrowing and bulging greatest at the foramina.
Degenerative facet spurring on both sides. Patent canal and foramina

L3-L4: Disc collapse eccentric to the right where there is greater
endplate and facet spurring. Right subarticular recess stenosis that
is mild. Patent foramina

L4-L5: Disc narrowing and bulging. Degenerative facet spurring with
ligamentum flavum thickening and anterolisthesis. Advanced spinal
stenosis. Moderate right foraminal stenosis with root flattening on
sagittal images.

L5-S1:Disc narrowing and bulging with endplate and facet spurring
asymmetric towards the left where there is foraminal impingement
causing L5 root flattening. Tiny left anterior synovial cyst seen on
sagittal images, 3 mm in maximal span on sagittal T2 weighted
imaging.
IMPRESSION: 1. Stable compared to [DATE].
2. Generalized spinal degeneration with scoliosis.
3. L4-5 severe spinal stenosis. Right foraminal impingement at this
level.
4. L5-S1 left foraminal impingement.

## 2021-11-21 DIAGNOSIS — M5416 Radiculopathy, lumbar region: Secondary | ICD-10-CM | POA: Diagnosis not present

## 2021-12-03 DIAGNOSIS — R3912 Poor urinary stream: Secondary | ICD-10-CM | POA: Diagnosis not present

## 2021-12-03 DIAGNOSIS — N401 Enlarged prostate with lower urinary tract symptoms: Secondary | ICD-10-CM | POA: Diagnosis not present

## 2021-12-03 DIAGNOSIS — R972 Elevated prostate specific antigen [PSA]: Secondary | ICD-10-CM | POA: Diagnosis not present

## 2021-12-14 ENCOUNTER — Other Ambulatory Visit (HOSPITAL_BASED_OUTPATIENT_CLINIC_OR_DEPARTMENT_OTHER): Payer: Self-pay

## 2021-12-14 DIAGNOSIS — M48062 Spinal stenosis, lumbar region with neurogenic claudication: Secondary | ICD-10-CM | POA: Diagnosis not present

## 2021-12-14 DIAGNOSIS — M48061 Spinal stenosis, lumbar region without neurogenic claudication: Secondary | ICD-10-CM | POA: Diagnosis not present

## 2021-12-14 DIAGNOSIS — M5136 Other intervertebral disc degeneration, lumbar region: Secondary | ICD-10-CM | POA: Diagnosis not present

## 2021-12-14 MED ORDER — METHOCARBAMOL 500 MG PO TABS
ORAL_TABLET | ORAL | 1 refills | Status: AC
  Filled 2021-12-14: qty 60, 15d supply, fill #0

## 2021-12-14 MED ORDER — OXYCODONE-ACETAMINOPHEN 5-325 MG PO TABS
ORAL_TABLET | ORAL | 0 refills | Status: AC
  Filled 2021-12-14: qty 40, 7d supply, fill #0

## 2021-12-25 ENCOUNTER — Other Ambulatory Visit (HOSPITAL_BASED_OUTPATIENT_CLINIC_OR_DEPARTMENT_OTHER): Payer: Self-pay

## 2021-12-25 MED ORDER — AMOXICILLIN 500 MG PO CAPS
ORAL_CAPSULE | ORAL | 0 refills | Status: AC
  Filled 2021-12-25: qty 8, 2d supply, fill #0

## 2022-01-01 ENCOUNTER — Other Ambulatory Visit (HOSPITAL_BASED_OUTPATIENT_CLINIC_OR_DEPARTMENT_OTHER): Payer: Self-pay

## 2022-01-01 MED ORDER — ATORVASTATIN CALCIUM 40 MG PO TABS
40.0000 mg | ORAL_TABLET | Freq: Every day | ORAL | 2 refills | Status: DC
  Filled 2022-01-01: qty 90, 90d supply, fill #0
  Filled 2022-04-09: qty 90, 90d supply, fill #1
  Filled 2022-07-15: qty 90, 90d supply, fill #2

## 2022-01-02 DIAGNOSIS — R972 Elevated prostate specific antigen [PSA]: Secondary | ICD-10-CM | POA: Diagnosis not present

## 2022-01-07 ENCOUNTER — Other Ambulatory Visit (HOSPITAL_BASED_OUTPATIENT_CLINIC_OR_DEPARTMENT_OTHER): Payer: Self-pay

## 2022-01-07 DIAGNOSIS — R972 Elevated prostate specific antigen [PSA]: Secondary | ICD-10-CM | POA: Diagnosis not present

## 2022-01-07 DIAGNOSIS — N401 Enlarged prostate with lower urinary tract symptoms: Secondary | ICD-10-CM | POA: Diagnosis not present

## 2022-01-07 MED ORDER — TAMSULOSIN HCL 0.4 MG PO CAPS
ORAL_CAPSULE | ORAL | 2 refills | Status: AC
  Filled 2022-01-07: qty 30, 30d supply, fill #0

## 2022-01-21 DIAGNOSIS — M6281 Muscle weakness (generalized): Secondary | ICD-10-CM | POA: Diagnosis not present

## 2022-01-21 DIAGNOSIS — M5416 Radiculopathy, lumbar region: Secondary | ICD-10-CM | POA: Diagnosis not present

## 2022-01-21 DIAGNOSIS — G8929 Other chronic pain: Secondary | ICD-10-CM | POA: Diagnosis not present

## 2022-01-21 DIAGNOSIS — M5441 Lumbago with sciatica, right side: Secondary | ICD-10-CM | POA: Diagnosis not present

## 2022-02-07 ENCOUNTER — Other Ambulatory Visit (HOSPITAL_BASED_OUTPATIENT_CLINIC_OR_DEPARTMENT_OTHER): Payer: Self-pay

## 2022-02-07 MED ORDER — TAMSULOSIN HCL 0.4 MG PO CAPS
0.4000 mg | ORAL_CAPSULE | Freq: Every day | ORAL | 3 refills | Status: DC
  Filled 2022-02-07: qty 90, 90d supply, fill #0
  Filled 2022-05-20: qty 90, 90d supply, fill #1
  Filled 2022-08-16: qty 90, 90d supply, fill #2
  Filled 2022-11-26: qty 90, 90d supply, fill #3

## 2022-02-08 DIAGNOSIS — M6281 Muscle weakness (generalized): Secondary | ICD-10-CM | POA: Diagnosis not present

## 2022-02-08 DIAGNOSIS — M5416 Radiculopathy, lumbar region: Secondary | ICD-10-CM | POA: Diagnosis not present

## 2022-02-08 DIAGNOSIS — G8929 Other chronic pain: Secondary | ICD-10-CM | POA: Diagnosis not present

## 2022-02-08 DIAGNOSIS — M5441 Lumbago with sciatica, right side: Secondary | ICD-10-CM | POA: Diagnosis not present

## 2022-03-18 DIAGNOSIS — M5441 Lumbago with sciatica, right side: Secondary | ICD-10-CM | POA: Diagnosis not present

## 2022-03-18 DIAGNOSIS — M6281 Muscle weakness (generalized): Secondary | ICD-10-CM | POA: Diagnosis not present

## 2022-03-18 DIAGNOSIS — M5416 Radiculopathy, lumbar region: Secondary | ICD-10-CM | POA: Diagnosis not present

## 2022-03-18 DIAGNOSIS — G8929 Other chronic pain: Secondary | ICD-10-CM | POA: Diagnosis not present

## 2022-04-01 DIAGNOSIS — M5441 Lumbago with sciatica, right side: Secondary | ICD-10-CM | POA: Diagnosis not present

## 2022-04-01 DIAGNOSIS — M6281 Muscle weakness (generalized): Secondary | ICD-10-CM | POA: Diagnosis not present

## 2022-04-01 DIAGNOSIS — G8929 Other chronic pain: Secondary | ICD-10-CM | POA: Diagnosis not present

## 2022-04-01 DIAGNOSIS — M5416 Radiculopathy, lumbar region: Secondary | ICD-10-CM | POA: Diagnosis not present

## 2022-04-09 ENCOUNTER — Other Ambulatory Visit (HOSPITAL_BASED_OUTPATIENT_CLINIC_OR_DEPARTMENT_OTHER): Payer: Self-pay

## 2022-04-18 DIAGNOSIS — M5416 Radiculopathy, lumbar region: Secondary | ICD-10-CM | POA: Diagnosis not present

## 2022-04-18 DIAGNOSIS — M6281 Muscle weakness (generalized): Secondary | ICD-10-CM | POA: Diagnosis not present

## 2022-04-18 DIAGNOSIS — G8929 Other chronic pain: Secondary | ICD-10-CM | POA: Diagnosis not present

## 2022-04-18 DIAGNOSIS — M5441 Lumbago with sciatica, right side: Secondary | ICD-10-CM | POA: Diagnosis not present

## 2022-05-29 DIAGNOSIS — R778 Other specified abnormalities of plasma proteins: Secondary | ICD-10-CM | POA: Diagnosis not present

## 2022-07-15 ENCOUNTER — Other Ambulatory Visit: Payer: Self-pay

## 2022-09-12 ENCOUNTER — Other Ambulatory Visit (HOSPITAL_BASED_OUTPATIENT_CLINIC_OR_DEPARTMENT_OTHER): Payer: Self-pay

## 2022-10-15 ENCOUNTER — Other Ambulatory Visit (HOSPITAL_BASED_OUTPATIENT_CLINIC_OR_DEPARTMENT_OTHER): Payer: Self-pay

## 2022-10-15 MED ORDER — ATORVASTATIN CALCIUM 40 MG PO TABS
40.0000 mg | ORAL_TABLET | Freq: Every day | ORAL | 0 refills | Status: DC
  Filled 2022-10-15: qty 90, 90d supply, fill #0

## 2022-10-15 MED ORDER — FENOFIBRATE 160 MG PO TABS
160.0000 mg | ORAL_TABLET | Freq: Every day | ORAL | 0 refills | Status: DC
  Filled 2022-10-15: qty 90, 90d supply, fill #0

## 2023-01-20 ENCOUNTER — Other Ambulatory Visit (HOSPITAL_BASED_OUTPATIENT_CLINIC_OR_DEPARTMENT_OTHER): Payer: Self-pay

## 2023-01-20 MED ORDER — ATORVASTATIN CALCIUM 40 MG PO TABS
40.0000 mg | ORAL_TABLET | Freq: Every day | ORAL | 0 refills | Status: DC
  Filled 2023-01-20: qty 90, 90d supply, fill #0

## 2023-01-20 MED ORDER — FENOFIBRATE 160 MG PO TABS
160.0000 mg | ORAL_TABLET | Freq: Every day | ORAL | 0 refills | Status: DC
  Filled 2023-01-20: qty 90, 90d supply, fill #0

## 2023-01-28 ENCOUNTER — Other Ambulatory Visit: Payer: Self-pay

## 2023-03-03 ENCOUNTER — Other Ambulatory Visit (HOSPITAL_BASED_OUTPATIENT_CLINIC_OR_DEPARTMENT_OTHER): Payer: Self-pay

## 2023-03-03 MED ORDER — TAMSULOSIN HCL 0.4 MG PO CAPS
0.4000 mg | ORAL_CAPSULE | Freq: Every day | ORAL | 3 refills | Status: DC
  Filled 2023-03-03: qty 90, 90d supply, fill #0
  Filled 2023-05-29: qty 90, 90d supply, fill #1
  Filled 2023-08-26: qty 90, 90d supply, fill #2
  Filled 2023-11-27: qty 90, 90d supply, fill #3

## 2023-04-30 ENCOUNTER — Other Ambulatory Visit (HOSPITAL_BASED_OUTPATIENT_CLINIC_OR_DEPARTMENT_OTHER): Payer: Self-pay

## 2023-04-30 MED ORDER — ATORVASTATIN CALCIUM 40 MG PO TABS
40.0000 mg | ORAL_TABLET | Freq: Every day | ORAL | 0 refills | Status: DC
  Filled 2023-04-30: qty 90, 90d supply, fill #0

## 2023-04-30 MED ORDER — FENOFIBRATE 160 MG PO TABS
160.0000 mg | ORAL_TABLET | Freq: Every day | ORAL | 0 refills | Status: DC
  Filled 2023-04-30: qty 90, 90d supply, fill #0

## 2023-06-11 ENCOUNTER — Other Ambulatory Visit (HOSPITAL_BASED_OUTPATIENT_CLINIC_OR_DEPARTMENT_OTHER): Payer: Self-pay

## 2023-07-30 ENCOUNTER — Other Ambulatory Visit (HOSPITAL_BASED_OUTPATIENT_CLINIC_OR_DEPARTMENT_OTHER): Payer: Self-pay

## 2023-07-31 ENCOUNTER — Other Ambulatory Visit (HOSPITAL_BASED_OUTPATIENT_CLINIC_OR_DEPARTMENT_OTHER): Payer: Self-pay

## 2023-07-31 MED ORDER — FENOFIBRATE 160 MG PO TABS
160.0000 mg | ORAL_TABLET | Freq: Every day | ORAL | 0 refills | Status: DC
  Filled 2023-07-31: qty 90, 90d supply, fill #0

## 2023-07-31 MED ORDER — ATORVASTATIN CALCIUM 40 MG PO TABS
40.0000 mg | ORAL_TABLET | Freq: Every day | ORAL | 0 refills | Status: DC
  Filled 2023-07-31: qty 90, 90d supply, fill #0

## 2023-08-08 ENCOUNTER — Other Ambulatory Visit (HOSPITAL_BASED_OUTPATIENT_CLINIC_OR_DEPARTMENT_OTHER): Payer: Self-pay

## 2023-08-08 MED ORDER — CICLOPIROX OLAMINE 0.77 % EX CREA
1.0000 | TOPICAL_CREAM | Freq: Two times a day (BID) | CUTANEOUS | 1 refills | Status: AC
  Filled 2023-08-08: qty 30, 30d supply, fill #0

## 2023-08-08 MED ORDER — LISINOPRIL 5 MG PO TABS
5.0000 mg | ORAL_TABLET | Freq: Every day | ORAL | 3 refills | Status: AC
  Filled 2023-08-08: qty 90, 90d supply, fill #0
  Filled 2023-10-31: qty 90, 90d supply, fill #1
  Filled 2024-02-03: qty 90, 90d supply, fill #2
  Filled 2024-05-04: qty 90, 90d supply, fill #3

## 2023-08-11 ENCOUNTER — Other Ambulatory Visit (HOSPITAL_BASED_OUTPATIENT_CLINIC_OR_DEPARTMENT_OTHER): Payer: Self-pay

## 2023-08-13 ENCOUNTER — Other Ambulatory Visit (HOSPITAL_BASED_OUTPATIENT_CLINIC_OR_DEPARTMENT_OTHER): Payer: Self-pay

## 2023-08-15 ENCOUNTER — Other Ambulatory Visit (HOSPITAL_BASED_OUTPATIENT_CLINIC_OR_DEPARTMENT_OTHER): Payer: Self-pay

## 2023-08-15 MED ORDER — CICLOPIROX 8 % EX SOLN
1.0000 | Freq: Every day | CUTANEOUS | 3 refills | Status: AC
Start: 2023-08-15 — End: ?
  Filled 2023-08-15 – 2023-08-26 (×2): qty 6.6, 28d supply, fill #0

## 2023-08-18 ENCOUNTER — Other Ambulatory Visit (HOSPITAL_BASED_OUTPATIENT_CLINIC_OR_DEPARTMENT_OTHER): Payer: Self-pay

## 2023-08-26 ENCOUNTER — Other Ambulatory Visit (HOSPITAL_BASED_OUTPATIENT_CLINIC_OR_DEPARTMENT_OTHER): Payer: Self-pay

## 2023-08-27 ENCOUNTER — Other Ambulatory Visit (HOSPITAL_BASED_OUTPATIENT_CLINIC_OR_DEPARTMENT_OTHER): Payer: Self-pay

## 2023-10-31 ENCOUNTER — Other Ambulatory Visit (HOSPITAL_BASED_OUTPATIENT_CLINIC_OR_DEPARTMENT_OTHER): Payer: Self-pay

## 2023-10-31 MED ORDER — FENOFIBRATE 160 MG PO TABS
160.0000 mg | ORAL_TABLET | Freq: Every day | ORAL | 0 refills | Status: DC
  Filled 2023-10-31: qty 90, 90d supply, fill #0

## 2023-10-31 MED ORDER — ATORVASTATIN CALCIUM 40 MG PO TABS
40.0000 mg | ORAL_TABLET | Freq: Every day | ORAL | 0 refills | Status: DC
  Filled 2023-10-31: qty 90, 90d supply, fill #0

## 2024-02-03 ENCOUNTER — Other Ambulatory Visit (HOSPITAL_BASED_OUTPATIENT_CLINIC_OR_DEPARTMENT_OTHER): Payer: Self-pay

## 2024-02-04 ENCOUNTER — Other Ambulatory Visit (HOSPITAL_BASED_OUTPATIENT_CLINIC_OR_DEPARTMENT_OTHER): Payer: Self-pay

## 2024-02-04 MED ORDER — FENOFIBRATE 160 MG PO TABS
160.0000 mg | ORAL_TABLET | Freq: Every day | ORAL | 0 refills | Status: DC
  Filled 2024-02-04: qty 90, 90d supply, fill #0

## 2024-02-04 MED ORDER — ATORVASTATIN CALCIUM 40 MG PO TABS
40.0000 mg | ORAL_TABLET | Freq: Every day | ORAL | 0 refills | Status: DC
  Filled 2024-02-04: qty 90, 90d supply, fill #0

## 2024-02-24 ENCOUNTER — Other Ambulatory Visit (HOSPITAL_BASED_OUTPATIENT_CLINIC_OR_DEPARTMENT_OTHER): Payer: Self-pay

## 2024-02-24 MED ORDER — TAMSULOSIN HCL 0.4 MG PO CAPS
0.4000 mg | ORAL_CAPSULE | Freq: Every day | ORAL | 3 refills | Status: AC
  Filled 2024-02-24: qty 90, 90d supply, fill #0
  Filled 2024-05-18 – 2024-05-31 (×2): qty 90, 90d supply, fill #1

## 2024-03-25 ENCOUNTER — Other Ambulatory Visit (HOSPITAL_COMMUNITY): Payer: Self-pay

## 2024-05-04 ENCOUNTER — Other Ambulatory Visit (HOSPITAL_BASED_OUTPATIENT_CLINIC_OR_DEPARTMENT_OTHER): Payer: Self-pay

## 2024-05-04 ENCOUNTER — Other Ambulatory Visit: Payer: Self-pay

## 2024-05-06 ENCOUNTER — Other Ambulatory Visit (HOSPITAL_BASED_OUTPATIENT_CLINIC_OR_DEPARTMENT_OTHER): Payer: Self-pay

## 2024-05-06 MED ORDER — FENOFIBRATE 160 MG PO TABS
160.0000 mg | ORAL_TABLET | Freq: Every day | ORAL | 0 refills | Status: AC
  Filled 2024-05-06 (×2): qty 90, 90d supply, fill #0

## 2024-05-06 MED ORDER — ATORVASTATIN CALCIUM 40 MG PO TABS
40.0000 mg | ORAL_TABLET | Freq: Every day | ORAL | 0 refills | Status: AC
  Filled 2024-05-06 (×2): qty 90, 90d supply, fill #0

## 2024-05-07 ENCOUNTER — Other Ambulatory Visit: Payer: Self-pay

## 2024-05-07 ENCOUNTER — Other Ambulatory Visit (HOSPITAL_COMMUNITY): Payer: Self-pay

## 2024-05-07 ENCOUNTER — Other Ambulatory Visit (HOSPITAL_BASED_OUTPATIENT_CLINIC_OR_DEPARTMENT_OTHER): Payer: Self-pay

## 2024-05-31 ENCOUNTER — Other Ambulatory Visit (HOSPITAL_BASED_OUTPATIENT_CLINIC_OR_DEPARTMENT_OTHER): Payer: Self-pay
